# Patient Record
Sex: Male | Born: 1970 | Race: White | Hispanic: No | Marital: Married | Smoking: Former smoker
Health system: Southern US, Community
[De-identification: ages and names within clinical notes are randomized; demographics above are authoritative.]

## PROBLEM LIST (undated history)

## (undated) DIAGNOSIS — M179 Osteoarthritis of knee, unspecified: Secondary | ICD-10-CM

## (undated) DIAGNOSIS — E559 Vitamin D deficiency, unspecified: Secondary | ICD-10-CM

## (undated) DIAGNOSIS — Z8249 Family history of ischemic heart disease and other diseases of the circulatory system: Secondary | ICD-10-CM

## (undated) DIAGNOSIS — E781 Pure hyperglyceridemia: Secondary | ICD-10-CM

## (undated) DIAGNOSIS — G4700 Insomnia, unspecified: Secondary | ICD-10-CM

## (undated) DIAGNOSIS — I10 Essential (primary) hypertension: Secondary | ICD-10-CM

## (undated) DIAGNOSIS — E079 Disorder of thyroid, unspecified: Secondary | ICD-10-CM

## (undated) DIAGNOSIS — F1021 Alcohol dependence, in remission: Secondary | ICD-10-CM

## (undated) HISTORY — DX: Disorder of thyroid, unspecified: E07.9

## (undated) HISTORY — DX: Insomnia, unspecified: G47.00

## (undated) HISTORY — DX: Family history of ischemic heart disease and other diseases of the circulatory system: Z82.49

## (undated) HISTORY — DX: Essential (primary) hypertension: I10

## (undated) HISTORY — DX: Vitamin D deficiency, unspecified: E55.9

## (undated) HISTORY — DX: Osteoarthritis of knee, unspecified: M17.9

## (undated) HISTORY — DX: Alcohol dependence, in remission: F10.21

## (undated) HISTORY — DX: Pure hyperglyceridemia: E78.1

## (undated) HISTORY — DX: Hereditary hemochromatosis: E83.110

---

## 2004-06-26 HISTORY — PX: VASECTOMY: SHX75

## 2006-06-26 HISTORY — PX: EYE SURGERY: SHX253

## 2012-12-06 ENCOUNTER — Other Ambulatory Visit: Payer: Self-pay | Admitting: Endocrinology

## 2012-12-06 DIAGNOSIS — E213 Hyperparathyroidism, unspecified: Secondary | ICD-10-CM

## 2012-12-17 ENCOUNTER — Encounter (HOSPITAL_COMMUNITY): Payer: Self-pay

## 2012-12-19 ENCOUNTER — Encounter (HOSPITAL_COMMUNITY)
Admission: RE | Admit: 2012-12-19 | Discharge: 2012-12-19 | Disposition: A | Discharge status: Home / Self Care | Payer: 59 | Source: Ambulatory Visit | Attending: Endocrinology | Admitting: Endocrinology

## 2012-12-19 DIAGNOSIS — E213 Hyperparathyroidism, unspecified: Secondary | ICD-10-CM | POA: Insufficient documentation

## 2012-12-19 MED ORDER — TECHNETIUM TC 99M SESTAMIBI GENERIC - CARDIOLITE
25.0000 | Freq: Once | INTRAVENOUS | Status: AC | PRN
  Administered 2012-12-19: 25 via INTRAVENOUS

## 2012-12-23 ENCOUNTER — Encounter (HOSPITAL_COMMUNITY): Payer: 59

## 2012-12-23 ENCOUNTER — Encounter (HOSPITAL_COMMUNITY): Payer: Self-pay

## 2012-12-30 ENCOUNTER — Telehealth: Payer: Self-pay | Admitting: *Deleted

## 2012-12-30 NOTE — Telephone Encounter (Signed)
Pt called requesting results from his nuclear test done last week. Please advise.

## 2012-12-31 ENCOUNTER — Telehealth: Payer: Self-pay | Admitting: *Deleted

## 2012-12-31 DIAGNOSIS — E213 Hyperparathyroidism, unspecified: Secondary | ICD-10-CM

## 2012-12-31 NOTE — Addendum Note (Signed)
Addended by: Reather Littler on: 12/31/2012 09:37 AM   Modules accepted: Orders

## 2012-12-31 NOTE — Telephone Encounter (Signed)
LVM for pt to return call

## 2012-12-31 NOTE — Telephone Encounter (Signed)
Pt returned call. Advised him that he does have localized parathyroid abnormality in the inferior pole area of the left lobe of thyroid gland. Dr Lucianne Muss stated that if he was agreeable we can schedule him with Dr. Gerrit Friends for surgical consult. Pt states that would be great. If the appt could be scheduled on a Monday that would be the best day for him, due to his schedule. Please let the [facility] know this.Marland Kitchen

## 2012-12-31 NOTE — Telephone Encounter (Signed)
He does have localized parathyroid abnormality in the inferior pole area of the left lobe of thyroid gland, if agreeable we can schedule him with Dr. Gerrit Friends for surgical consult

## 2013-01-15 ENCOUNTER — Encounter (INDEPENDENT_AMBULATORY_CARE_PROVIDER_SITE_OTHER): Payer: Self-pay | Admitting: Surgery

## 2013-01-17 ENCOUNTER — Telehealth: Payer: Self-pay | Admitting: *Deleted

## 2013-01-17 NOTE — Telephone Encounter (Signed)
Arline Asp from [facility] called about this patient, she said that they need his parathyroid and calcium labs and your last documented note faxed to them at, [phone], he has an upcoming appt.

## 2013-01-21 ENCOUNTER — Encounter (INDEPENDENT_AMBULATORY_CARE_PROVIDER_SITE_OTHER): Payer: Self-pay | Admitting: Surgery

## 2013-01-21 ENCOUNTER — Ambulatory Visit (INDEPENDENT_AMBULATORY_CARE_PROVIDER_SITE_OTHER): Payer: 59 | Admitting: Surgery

## 2013-01-21 VITALS — BP 144/98 | HR 87 | Temp 98.0°F | Ht 71.0 in | Wt 179.4 lb

## 2013-01-21 DIAGNOSIS — E210 Primary hyperparathyroidism: Secondary | ICD-10-CM

## 2013-01-21 NOTE — Patient Instructions (Signed)

## 2013-01-21 NOTE — Progress Notes (Signed)
General Surgery [facility]  Chief Complaint  Patient presents with  . New Evaluation    eval parathyroid adenoma - referral from Dr. Reather Littler    HISTORY: Patient is a 42 year old white male referred by his endocrinologist for consideration for minimally invasive parathyroidectomy for management of primary hyperparathyroidism. Patient is followed closely by his primary care physician for hypertension. He was noted on routine laboratory studies to have an elevated serum calcium level of 11.2. He was referred to endocrinology. Further workup included a nuclear medicine parathyroid scan. This localized a parathyroid adenoma to the left inferior position. Patient is now referred to surgery for consideration for resection.  Patient has had no signs or symptoms. He denies nephrolithiasis. He has not had significant fatigue. He does have hypertension. He does not have urinary frequency.  Patient has had no prior head or neck surgery. There is no family history of endocrine disease.  Past Medical History  Diagnosis Date  . Hypertension   . Thyroid disease     Current Outpatient Prescriptions  Medication Sig Dispense Refill  . aspirin 81 MG tablet Take 81 mg by mouth daily.      Marland Kitchen losartan (COZAAR) 50 MG tablet        No current facility-administered medications for this visit.    No Known Allergies  Family History  Problem Relation Age of Onset  . Heart disease Father     History   Social History  . Marital Status: Married    Spouse Name: N/A    Number of Children: N/A  . Years of Education: N/A   Social History Main Topics  . Smoking status: Former Smoker    Quit date: 01/22/2011  . Smokeless tobacco: None  . Alcohol Use: Yes  . Drug Use: No  . Sexually Active: None   Other Topics Concern  . None   Social History Narrative  . None    REVIEW OF SYSTEMS - PERTINENT POSITIVES ONLY: Denies fatigue. Denies nephrolithiasis. Denies  tremor.  EXAM: Filed Vitals:   01/21/13 1012  BP: 144/98  Pulse: 87  Temp: 98 F (36.7 C)    HEENT: normocephalic; pupils equal and reactive; sclerae clear; dentition good; mucous membranes moist NECK:  symmetric on extension; no palpable anterior or posterior cervical lymphadenopathy; no supraclavicular masses; no tenderness CHEST: clear to auscultation bilaterally without rales, rhonchi, or wheezes CARDIAC: regular rate and rhythm without significant murmur; peripheral pulses are full EXT:  non-tender without edema; no deformity NEURO: no gross focal deficits; no sign of tremor   LABORATORY RESULTS: See [facility] (CHL-Epic) for most recent results  RADIOLOGY RESULTS: See [facility] (CHL-Epic) for most recent results  IMPRESSION: Primary hyperparathyroidism, likely left inferior parathyroid adenoma  PLAN: The patient and I reviewed his nuclear medicine study and his laboratory work. I gave him written literature on parathyroid disease and surgery for parathyroid adenoma to review at home. We discussed the procedure of minimally invasive parathyroidectomy. We discussed alternative surgery including 4 gland exploration. Patient understands there is a possibility of second gland adenoma at some point in the future.  We will plan for left inferior minimally invasive parathyroidectomy as an outpatient surgical procedure in the near future at a time convenient for the patient.  The risks and benefits of the procedure have been discussed at length with the patient.  The patient understands the proposed procedure, potential alternative treatments, and the course of recovery to be expected.  All of the patient's  questions have been answered at this time.  The patient wishes to proceed with surgery.  Velora Heckler, MD, FACS General & Endocrine Surgery [facility]  Primary Care Physician: Miguel Aschoff, MD

## 2013-01-23 ENCOUNTER — Encounter (INDEPENDENT_AMBULATORY_CARE_PROVIDER_SITE_OTHER): Payer: Self-pay

## 2013-02-03 ENCOUNTER — Other Ambulatory Visit: Payer: Self-pay | Admitting: Endocrinology

## 2013-02-03 ENCOUNTER — Other Ambulatory Visit: Payer: 59

## 2013-02-03 DIAGNOSIS — I10 Essential (primary) hypertension: Secondary | ICD-10-CM | POA: Insufficient documentation

## 2013-02-11 ENCOUNTER — Encounter (HOSPITAL_COMMUNITY): Payer: Self-pay | Admitting: Pharmacy Technician

## 2013-02-12 ENCOUNTER — Other Ambulatory Visit (HOSPITAL_COMMUNITY): Payer: Self-pay | Admitting: Surgery

## 2013-02-12 NOTE — Progress Notes (Signed)
OV WITH ekg Dr Anne Fu 4/14, aortic duplex scan 5/14, stress test with EKG 12/11 ALL ON CHART

## 2013-02-12 NOTE — Patient Instructions (Addendum)
20 Jachin Coury  02/12/2013   Your procedure is scheduled on:02/20/13  THURSDAY    Report to [facility] at   0900    AM.  Call this number if you have problems the morning of surgery: [phone]       Remember:   Do not eat food  Or drink :After Midnight. Wednesday NIGHT   Take these medicines the morning of surgery with A SIP OF WATER: NONE   .  Contacts, dentures or partial plates can not be worn to surgery  Leave suitcase in the car. After surgery it may be brought to your room.  For patients admitted to the hospital, checkout time is 11:00 AM day of  discharge.             SPECIAL INSTRUCTIONS- SEE [place] PREPARING FOR SURGERY INSTRUCTION SHEET-     DO NOT WEAR JEWELRY, LOTIONS, POWDERS, OR PERFUMES.  WOMEN-- DO NOT SHAVE LEGS OR UNDERARMS FOR 12 HOURS BEFORE SHOWERS. MEN MAY SHAVE FACE.  Patients discharged the day of surgery will not be allowed to drive home. IF going home the day of surgery, you must have a driver and someone to stay with you for the first 24 hours  Name and phone number of your driver:     Beatrix Shipper, RN,BSN  PST [phone]  [phone]                 FAILURE TO FOLLOW THESE INSTRUCTIONS MAY RESULT IN  CANCELLATION   OF YOUR SURGERY                                                  Patient Signature _____________________________

## 2013-02-13 ENCOUNTER — Encounter (HOSPITAL_COMMUNITY): Payer: Self-pay

## 2013-02-13 ENCOUNTER — Ambulatory Visit (HOSPITAL_COMMUNITY)
Admission: RE | Admit: 2013-02-13 | Discharge: 2013-02-13 | Disposition: A | Discharge status: Home / Self Care | Payer: 59 | Source: Ambulatory Visit | Attending: Surgery | Admitting: Surgery

## 2013-02-13 ENCOUNTER — Encounter (HOSPITAL_COMMUNITY)
Admission: RE | Admit: 2013-02-13 | Discharge: 2013-02-13 | Disposition: A | Discharge status: Home / Self Care | Payer: 59 | Source: Ambulatory Visit | Attending: Surgery | Admitting: Surgery

## 2013-02-13 DIAGNOSIS — I10 Essential (primary) hypertension: Secondary | ICD-10-CM | POA: Insufficient documentation

## 2013-02-13 DIAGNOSIS — Z01818 Encounter for other preprocedural examination: Secondary | ICD-10-CM | POA: Insufficient documentation

## 2013-02-13 DIAGNOSIS — E210 Primary hyperparathyroidism: Secondary | ICD-10-CM | POA: Insufficient documentation

## 2013-02-13 DIAGNOSIS — Z01812 Encounter for preprocedural laboratory examination: Secondary | ICD-10-CM | POA: Insufficient documentation

## 2013-02-13 LAB — BASIC METABOLIC PANEL
BUN: 19 mg/dL (ref 6–23)
CO2: 26 mEq/L (ref 19–32)
Chloride: 102 mEq/L (ref 96–112)
Glucose, Bld: 100 mg/dL — ABNORMAL HIGH (ref 70–99)
Potassium: 5.2 mEq/L — ABNORMAL HIGH (ref 3.5–5.1)

## 2013-02-13 LAB — CBC
HCT: 44.7 % (ref 39.0–52.0)
Hemoglobin: 15 g/dL (ref 13.0–17.0)
MCH: 32.1 pg (ref 26.0–34.0)
MCHC: 33.6 g/dL (ref 30.0–36.0)

## 2013-02-13 NOTE — Progress Notes (Signed)
Quick Note:  These results are acceptable for scheduled surgery.  Shariya Gaster M. Cleofas Hudgins, MD, FACS [facility] Office: [phone]   ______ 

## 2013-02-14 ENCOUNTER — Telehealth (INDEPENDENT_AMBULATORY_CARE_PROVIDER_SITE_OTHER): Payer: Self-pay | Admitting: *Deleted

## 2013-02-14 NOTE — Telephone Encounter (Signed)
Patient called today due to he thought he remembered being told he would need an EKG prior to surgery however he went to his pre-op yesterday and no EKG was done.  I do see an order for the EKG per anesthesia.  Patient asking what needs to happen at this time.  Explained that a message will be sent to Dr. Gerrit Friends to ask and then we will let him know.  Patient states understanding and agreeable at this time.

## 2013-02-14 NOTE — Telephone Encounter (Signed)
Per Dr Gerrit Friends I advised pt that per anesthesia means EKG only if anesthesia determines test is needed. Pt advised no EKG will be done unless pre surg testing advised pt they will be doing one.

## 2013-02-17 ENCOUNTER — Telehealth: Payer: Self-pay | Admitting: Endocrinology

## 2013-02-17 NOTE — Telephone Encounter (Signed)
Please see below and advise.

## 2013-02-17 NOTE — Telephone Encounter (Signed)
24 hr URINE completed at Urine.Marland KitchenMarland KitchenMarland KitchenGiven jug at [facility] but returned specimen to our office. He has never gotten results. Please call patient / Russell Rhodes.

## 2013-02-18 NOTE — Telephone Encounter (Signed)
Pt is aware of results. 

## 2013-02-18 NOTE — Telephone Encounter (Signed)
Test Was normal

## 2013-02-19 NOTE — Progress Notes (Signed)
Notified patient of surgery change time- to arrive at short stay at 0530am, NPO after midnight.  Notified Administrator in short stay that patient is aware of time change

## 2013-02-20 ENCOUNTER — Encounter (HOSPITAL_COMMUNITY): Payer: Self-pay | Admitting: *Deleted

## 2013-02-20 ENCOUNTER — Encounter (HOSPITAL_COMMUNITY): Payer: Self-pay | Admitting: Anesthesiology

## 2013-02-20 ENCOUNTER — Ambulatory Visit (HOSPITAL_COMMUNITY): Payer: 59 | Admitting: Anesthesiology

## 2013-02-20 ENCOUNTER — Ambulatory Visit (HOSPITAL_COMMUNITY)
Admission: RE | Admit: 2013-02-20 | Discharge: 2013-02-20 | Disposition: A | Discharge status: Home / Self Care | Payer: 59 | Source: Ambulatory Visit | Attending: Surgery | Admitting: Surgery

## 2013-02-20 ENCOUNTER — Encounter (HOSPITAL_COMMUNITY)
Admission: RE | Disposition: A | Discharge status: Home / Self Care | Payer: Self-pay | Source: Ambulatory Visit | Attending: Surgery

## 2013-02-20 DIAGNOSIS — E210 Primary hyperparathyroidism: Secondary | ICD-10-CM

## 2013-02-20 DIAGNOSIS — D351 Benign neoplasm of parathyroid gland: Secondary | ICD-10-CM | POA: Insufficient documentation

## 2013-02-20 DIAGNOSIS — Z7982 Long term (current) use of aspirin: Secondary | ICD-10-CM | POA: Insufficient documentation

## 2013-02-20 DIAGNOSIS — Z79899 Other long term (current) drug therapy: Secondary | ICD-10-CM | POA: Insufficient documentation

## 2013-02-20 DIAGNOSIS — I10 Essential (primary) hypertension: Secondary | ICD-10-CM | POA: Insufficient documentation

## 2013-02-20 HISTORY — PX: PARATHYROIDECTOMY: SHX19

## 2013-02-20 SURGERY — PARATHYROIDECTOMY
Anesthesia: General | Site: Neck | Laterality: Left | Wound class: Clean

## 2013-02-20 MED ORDER — SUCCINYLCHOLINE CHLORIDE 20 MG/ML IJ SOLN
INTRAMUSCULAR | Status: DC | PRN
  Administered 2013-02-20: 100 mg via INTRAVENOUS

## 2013-02-20 MED ORDER — GLYCOPYRROLATE 0.2 MG/ML IJ SOLN
INTRAMUSCULAR | Status: DC | PRN
  Administered 2013-02-20: 0.6 mg via INTRAVENOUS

## 2013-02-20 MED ORDER — MIDAZOLAM HCL 5 MG/5ML IJ SOLN
INTRAMUSCULAR | Status: DC | PRN
  Administered 2013-02-20: 1 mg via INTRAVENOUS
  Administered 2013-02-20: 2 mg via INTRAVENOUS
  Administered 2013-02-20: 1 mg via INTRAVENOUS

## 2013-02-20 MED ORDER — METOCLOPRAMIDE HCL 5 MG/ML IJ SOLN
INTRAMUSCULAR | Status: DC | PRN
  Administered 2013-02-20: 10 mg via INTRAVENOUS

## 2013-02-20 MED ORDER — OXYCODONE HCL 5 MG PO TABS
5.0000 mg | ORAL_TABLET | Freq: Once | ORAL | Status: AC | PRN
  Administered 2013-02-20: 5 mg via ORAL
  Filled 2013-02-20: qty 1

## 2013-02-20 MED ORDER — NEOSTIGMINE METHYLSULFATE 1 MG/ML IJ SOLN
INTRAMUSCULAR | Status: DC | PRN
  Administered 2013-02-20: 4 mg via INTRAVENOUS

## 2013-02-20 MED ORDER — OXYCODONE HCL 5 MG/5ML PO SOLN
5.0000 mg | Freq: Once | ORAL | Status: AC | PRN
  Filled 2013-02-20: qty 5

## 2013-02-20 MED ORDER — BUPIVACAINE HCL 0.25 % IJ SOLN
INTRAMUSCULAR | Status: DC | PRN
  Administered 2013-02-20: 10 mL

## 2013-02-20 MED ORDER — HYDROMORPHONE HCL PF 1 MG/ML IJ SOLN
INTRAMUSCULAR | Status: AC
  Filled 2013-02-20: qty 1

## 2013-02-20 MED ORDER — PROPOFOL 10 MG/ML IV BOLUS
INTRAVENOUS | Status: DC | PRN
  Administered 2013-02-20: 200 mg via INTRAVENOUS

## 2013-02-20 MED ORDER — DEXAMETHASONE SODIUM PHOSPHATE 10 MG/ML IJ SOLN
INTRAMUSCULAR | Status: DC | PRN
  Administered 2013-02-20: 10 mg via INTRAVENOUS

## 2013-02-20 MED ORDER — ROCURONIUM BROMIDE 100 MG/10ML IV SOLN
INTRAVENOUS | Status: DC | PRN
  Administered 2013-02-20: 10 mg via INTRAVENOUS

## 2013-02-20 MED ORDER — CEFAZOLIN SODIUM-DEXTROSE 2-3 GM-% IV SOLR
2.0000 g | INTRAVENOUS | Status: AC
  Administered 2013-02-20: 2 g via INTRAVENOUS

## 2013-02-20 MED ORDER — HYDROMORPHONE HCL PF 1 MG/ML IJ SOLN
0.2500 mg | INTRAMUSCULAR | Status: DC | PRN
  Administered 2013-02-20 (×2): 0.5 mg via INTRAVENOUS

## 2013-02-20 MED ORDER — HYDROCODONE-ACETAMINOPHEN 5-325 MG PO TABS: 1.0000 | ORAL_TABLET | ORAL | Status: DC | PRN

## 2013-02-20 MED ORDER — BUPIVACAINE HCL (PF) 0.25 % IJ SOLN
INTRAMUSCULAR | Status: AC
  Filled 2013-02-20: qty 30

## 2013-02-20 MED ORDER — SCOPOLAMINE 1 MG/3DAYS TD PT72
MEDICATED_PATCH | TRANSDERMAL | Status: AC
  Filled 2013-02-20: qty 1

## 2013-02-20 MED ORDER — SCOPOLAMINE 1 MG/3DAYS TD PT72
MEDICATED_PATCH | TRANSDERMAL | Status: DC | PRN
  Administered 2013-02-20: 1 via TRANSDERMAL

## 2013-02-20 MED ORDER — SUFENTANIL CITRATE 50 MCG/ML IV SOLN
INTRAVENOUS | Status: DC | PRN
  Administered 2013-02-20: 10 ug via INTRAVENOUS
  Administered 2013-02-20: 15 ug via INTRAVENOUS
  Administered 2013-02-20: 5 ug via INTRAVENOUS
  Administered 2013-02-20: 10 ug via INTRAVENOUS

## 2013-02-20 MED ORDER — CEFAZOLIN SODIUM-DEXTROSE 2-3 GM-% IV SOLR
INTRAVENOUS | Status: AC
  Filled 2013-02-20: qty 50

## 2013-02-20 MED ORDER — MEPERIDINE HCL 50 MG/ML IJ SOLN: 6.2500 mg | INTRAMUSCULAR | Status: DC | PRN

## 2013-02-20 MED ORDER — LIDOCAINE HCL (CARDIAC) 20 MG/ML IV SOLN
INTRAVENOUS | Status: DC | PRN
  Administered 2013-02-20: 80 mg via INTRAVENOUS

## 2013-02-20 MED ORDER — ONDANSETRON HCL 4 MG/2ML IJ SOLN
INTRAMUSCULAR | Status: DC | PRN
  Administered 2013-02-20: 4 mg via INTRAVENOUS

## 2013-02-20 MED ORDER — PROMETHAZINE HCL 25 MG/ML IJ SOLN: 6.2500 mg | INTRAMUSCULAR | Status: DC | PRN

## 2013-02-20 MED ORDER — LACTATED RINGERS IV SOLN
INTRAVENOUS | Status: DC | PRN
  Administered 2013-02-20 (×2): via INTRAVENOUS

## 2013-02-20 SURGICAL SUPPLY — 38 items
ATTRACTOMAT 16X20 MAGNETIC DRP (DRAPES) ×2 IMPLANT
BENZOIN TINCTURE PRP APPL 2/3 (GAUZE/BANDAGES/DRESSINGS) ×2 IMPLANT
BLADE HEX COATED 2.75 (ELECTRODE) ×2 IMPLANT
BLADE SURG 15 STRL LF DISP TIS (BLADE) ×1 IMPLANT
BLADE SURG 15 STRL SS (BLADE) ×1
CANISTER SUCTION 2500CC (MISCELLANEOUS) ×2 IMPLANT
CHLORAPREP W/TINT 10.5 ML (MISCELLANEOUS) ×2 IMPLANT
CLIP TI MEDIUM 6 (CLIP) ×4 IMPLANT
CLIP TI WIDE RED SMALL 6 (CLIP) ×4 IMPLANT
CLOTH BEACON ORANGE TIMEOUT ST (SAFETY) ×2 IMPLANT
DRAPE PED LAPAROTOMY (DRAPES) ×2 IMPLANT
DRESSING SURGICEL FIBRLLR 1X2 (HEMOSTASIS) ×1 IMPLANT
DRSG SURGICEL FIBRILLAR 1X2 (HEMOSTASIS) ×2
ELECT REM PT RETURN 9FT ADLT (ELECTROSURGICAL) ×2
ELECTRODE REM PT RTRN 9FT ADLT (ELECTROSURGICAL) ×1 IMPLANT
GAUZE SPONGE 4X4 16PLY XRAY LF (GAUZE/BANDAGES/DRESSINGS) ×2 IMPLANT
GLOVE SURG ORTHO 8.0 STRL STRW (GLOVE) ×2 IMPLANT
GOWN STRL REIN XL XLG (GOWN DISPOSABLE) ×6 IMPLANT
KIT BASIN OR (CUSTOM PROCEDURE TRAY) ×2 IMPLANT
NEEDLE HYPO 25X1 1.5 SAFETY (NEEDLE) ×2 IMPLANT
NS IRRIG 1000ML POUR BTL (IV SOLUTION) ×2 IMPLANT
PACK BASIC VI WITH GOWN DISP (CUSTOM PROCEDURE TRAY) ×2 IMPLANT
PENCIL BUTTON HOLSTER BLD 10FT (ELECTRODE) ×2 IMPLANT
SPONGE GAUZE 4X4 12PLY (GAUZE/BANDAGES/DRESSINGS) ×2 IMPLANT
STAPLER VISISTAT 35W (STAPLE) ×2 IMPLANT
STRIP CLOSURE SKIN 1/2X4 (GAUZE/BANDAGES/DRESSINGS) ×2 IMPLANT
SUT MNCRL AB 4-0 PS2 18 (SUTURE) ×2 IMPLANT
SUT SILK 2 0 (SUTURE)
SUT SILK 2-0 18XBRD TIE 12 (SUTURE) IMPLANT
SUT SILK 3 0 (SUTURE)
SUT SILK 3-0 18XBRD TIE 12 (SUTURE) IMPLANT
SUT VIC AB 3-0 SH 18 (SUTURE) ×2 IMPLANT
SYR BULB IRRIGATION 50ML (SYRINGE) ×2 IMPLANT
SYR CONTROL 10ML LL (SYRINGE) ×2 IMPLANT
TAPE CLOTH SURG 4X10 WHT LF (GAUZE/BANDAGES/DRESSINGS) ×2 IMPLANT
TOWEL OR 17X26 10 PK STRL BLUE (TOWEL DISPOSABLE) ×2 IMPLANT
TOWEL OR NON WOVEN STRL DISP B (DISPOSABLE) ×2 IMPLANT
YANKAUER SUCT BULB TIP 10FT TU (MISCELLANEOUS) ×2 IMPLANT

## 2013-02-20 NOTE — Interval H&P Note (Signed)
History and Physical Interval Note:  02/20/2013 7:18 AM  Russell Rhodes  has presented today for surgery, with the diagnosis of primary hyperparathyroidism.   The various methods of treatment have been discussed with the patient and family. After consideration of risks, benefits and other options for treatment, the patient has consented to    Procedure(s): left inferior PARATHYROIDECTOMY frozen section (Left) as a surgical intervention .    The patient's history has been reviewed, patient examined, no change in status, stable for surgery.  I have reviewed the patient's chart and labs.  Questions were answered to the patient's satisfaction.    Velora Heckler, MD, FACS General & Endocrine Surgery [facility] Office: [phone]  Russell Rhodes Judie Petit

## 2013-02-20 NOTE — Op Note (Signed)
OPERATIVE REPORT - PARATHYROIDECTOMY  Preoperative diagnosis: Primary hyperparathyroidism  Postop diagnosis: Same  Procedure: Left superior minimally invasive parathyroidectomy  Surgeon:  Velora Heckler, MD, FACS  Anesthesia: Gen. endotracheal  Estimated blood loss: Minimal  Preparation: ChloraPrep  Indications: Patient is a 42 year old white male referred by his endocrinologist for consideration for minimally invasive parathyroidectomy for management of primary hyperparathyroidism. Patient is followed closely by his primary care physician for hypertension. He was noted on routine laboratory studies to have an elevated serum calcium level of 11.2. He was referred to endocrinology. Further workup included a nuclear medicine parathyroid scan. This localized a parathyroid adenoma to the left inferior position.  Patient now comes to surgery for minimally invasive parathyroidectomy.  Procedure: Patient was prepared in the holding area. He was brought to operating room and placed in a supine position on the operating room table. Following administration of general anesthesia, the patient was positioned and then prepped and draped in the usual strict aseptic fashion. After ascertaining that an adequate level of anesthesia been achieved, a neck incision was made with a #15 blade. Dissection was carried through subcutaneous tissues and platysma. Hemostasis was obtained with the electrocautery. Skin flaps were developed circumferentially and a Weitlander retractor was placed for exposure.  Strap muscles were incised in the midline. Strap muscles were reflected exposing the thyroid lobe. With gentle blunt dissection the thyroid lobe was mobilized.  Dissection was carried through adipose tissue and an enlarged parathyroid gland was identified. It was gently mobilized. Vascular structures were divided between small and medium ligaclips. Care was taken to avoid the recurrent laryngeal nerve and the esophagus.  The parathyroid gland was completely excised. It was submitted to pathology where frozen section confirmed parathyroid tissue consistent with adenoma.  Neck was irrigated with warm saline and good hemostasis was noted. Fibrillar was placed in the operative field. Strap muscles were reapproximated in the midline with interrupted 3-0 Vicryl sutures. Platysma was closed with interrupted 3-0 Vicryl sutures. Skin was closed with a running 4-0 Monocryl subcuticular suture. Marcaine was infiltrated circumferentially. Wound was washed and dried and benzoin and Steri-Strips were applied. Sterile gauze dressings were applied. Patient was awakened from anesthesia and brought to the recovery room. The patient tolerated the procedure well.   Velora Heckler, MD, FACS General & Endocrine Surgery [facility]

## 2013-02-20 NOTE — H&P (View-Only) (Signed)
General Surgery - [facility]  Chief Complaint  Patient presents with  . New Evaluation    eval parathyroid adenoma - referral from Dr. Ajay Kumar    HISTORY: Patient is a 42-year-old white male referred by his endocrinologist for consideration for minimally invasive parathyroidectomy for management of primary hyperparathyroidism. Patient is followed closely by his primary care physician for hypertension. He was noted on routine laboratory studies to have an elevated serum calcium level of 11.2. He was referred to endocrinology. Further workup included a nuclear medicine parathyroid scan. This localized a parathyroid adenoma to the left inferior position. Patient is now referred to surgery for consideration for resection.  Patient has had no signs or symptoms. He denies nephrolithiasis. He has not had significant fatigue. He does have hypertension. He does not have urinary frequency.  Patient has had no prior head or neck surgery. There is no family history of endocrine disease.  Past Medical History  Diagnosis Date  . Hypertension   . Thyroid disease     Current Outpatient Prescriptions  Medication Sig Dispense Refill  . aspirin 81 MG tablet Take 81 mg by mouth daily.      . losartan (COZAAR) 50 MG tablet        No current facility-administered medications for this visit.    No Known Allergies  Family History  Problem Relation Age of Onset  . Heart disease Father     History   Social History  . Marital Status: Married    Spouse Name: N/A    Number of Children: N/A  . Years of Education: N/A   Social History Main Topics  . Smoking status: Former Smoker    Quit date: 01/22/2011  . Smokeless tobacco: None  . Alcohol Use: Yes  . Drug Use: No  . Sexually Active: None   Other Topics Concern  . None   Social History Narrative  . None    REVIEW OF SYSTEMS - PERTINENT POSITIVES ONLY: Denies fatigue. Denies nephrolithiasis. Denies  tremor.  EXAM: Filed Vitals:   01/21/13 1012  BP: 144/98  Pulse: 87  Temp: 98 F (36.7 C)    HEENT: normocephalic; pupils equal and reactive; sclerae clear; dentition good; mucous membranes moist NECK:  symmetric on extension; no palpable anterior or posterior cervical lymphadenopathy; no supraclavicular masses; no tenderness CHEST: clear to auscultation bilaterally without rales, rhonchi, or wheezes CARDIAC: regular rate and rhythm without significant murmur; peripheral pulses are full EXT:  non-tender without edema; no deformity NEURO: no gross focal deficits; no sign of tremor   LABORATORY RESULTS: See [facility] (CHL-Epic) for most recent results  RADIOLOGY RESULTS: See [facility] (CHL-Epic) for most recent results  IMPRESSION: Primary hyperparathyroidism, likely left inferior parathyroid adenoma  PLAN: The patient and I reviewed his nuclear medicine study and his laboratory work. I gave him written literature on parathyroid disease and surgery for parathyroid adenoma to review at home. We discussed the procedure of minimally invasive parathyroidectomy. We discussed alternative surgery including 4 gland exploration. Patient understands there is a possibility of second gland adenoma at some point in the future.  We will plan for left inferior minimally invasive parathyroidectomy as an outpatient surgical procedure in the near future at a time convenient for the patient.  The risks and benefits of the procedure have been discussed at length with the patient.  The patient understands the proposed procedure, potential alternative treatments, and the course of recovery to be expected.  All of the patient's   questions have been answered at this time.  The patient wishes to proceed with surgery.  Dina Warbington M. Anara Cowman, MD, FACS General & Endocrine Surgery [facility]  Primary Care Physician: ROSS,ALLAN, MD   

## 2013-02-20 NOTE — Anesthesia Postprocedure Evaluation (Signed)
Anesthesia Post Note  Patient: Russell Rhodes  Procedure(s) Performed: Procedure(s) (LRB): left superior minimally invasive PARATHYROIDECTOMY  (Left)  Anesthesia type: General  Patient location: PACU  Post pain: Pain level controlled  Post assessment: Post-op Vital signs reviewed  Last Vitals: BP 140/84  Pulse 76  Temp(Src) 36.7 C (Oral)  Resp 18  SpO2 100%  Post vital signs: Reviewed  Level of consciousness: sedated  Complications: No apparent anesthesia complications

## 2013-02-20 NOTE — Addendum Note (Signed)
Addendum created 02/20/13 1005 by Illene Silver, CRNA   Modules edited: Anesthesia Medication Administration

## 2013-02-20 NOTE — Transfer of Care (Signed)
Immediate Anesthesia Transfer of Care Note  Patient: Russell Rhodes  Procedure(s) Performed: Procedure(s): left superior minimally invasive PARATHYROIDECTOMY  (Left)  Patient Location: PACU  Anesthesia Type:General  Level of Consciousness: awake, alert , oriented and patient cooperative  Airway & Oxygen Therapy: Patient Spontanous Breathing and Patient connected to face mask oxygen  Post-op Assessment: Report given to PACU RN, Post -op Vital signs reviewed and stable and Patient moving all extremities X 4  Post vital signs: stable  Complications: No apparent anesthesia complications

## 2013-02-20 NOTE — Anesthesia Preprocedure Evaluation (Signed)
Anesthesia Evaluation  Patient identified by MRN, date of birth, ID band Patient awake    Reviewed: Allergy & Precautions, H&P , NPO status , Patient's Chart, lab work & pertinent test results  Airway Mallampati: II TM Distance: >3 FB Neck ROM: Full    Dental  (+) Dental Advisory Given and Teeth Intact   Pulmonary neg pulmonary ROS,  breath sounds clear to auscultation        Cardiovascular hypertension, Pt. on medications Rhythm:Regular Rate:Normal     Neuro/Psych negative neurological ROS  negative psych ROS   GI/Hepatic negative GI ROS, Neg liver ROS,   Endo/Other  negative endocrine ROS  Renal/GU negative Renal ROS     Musculoskeletal negative musculoskeletal ROS (+)   Abdominal   Peds  Hematology negative hematology ROS (+)   Anesthesia Other Findings   Reproductive/Obstetrics                           Anesthesia Physical Anesthesia Plan  ASA: II  Anesthesia Plan: General   Post-op Pain Management:    Induction: Intravenous  Airway Management Planned: Oral ETT  Additional Equipment:   Intra-op Plan:   Post-operative Plan: Extubation in OR  Informed Consent: I have reviewed the patients History and Physical, chart, labs and discussed the procedure including the risks, benefits and alternatives for the proposed anesthesia with the patient or authorized representative who has indicated his/her understanding and acceptance.   Dental advisory given  Plan Discussed with: CRNA  Anesthesia Plan Comments:         Anesthesia Quick Evaluation

## 2013-02-21 ENCOUNTER — Encounter (HOSPITAL_COMMUNITY): Payer: Self-pay | Admitting: Surgery

## 2013-02-25 ENCOUNTER — Other Ambulatory Visit (INDEPENDENT_AMBULATORY_CARE_PROVIDER_SITE_OTHER): Payer: Self-pay

## 2013-02-25 ENCOUNTER — Telehealth (INDEPENDENT_AMBULATORY_CARE_PROVIDER_SITE_OTHER): Payer: Self-pay

## 2013-02-25 NOTE — Telephone Encounter (Signed)
LMOM. PO appt 03-18-13 and to have labs drawn 03-14-13.

## 2013-03-18 ENCOUNTER — Ambulatory Visit (INDEPENDENT_AMBULATORY_CARE_PROVIDER_SITE_OTHER): Payer: 59 | Admitting: Surgery

## 2013-03-18 ENCOUNTER — Encounter (INDEPENDENT_AMBULATORY_CARE_PROVIDER_SITE_OTHER): Payer: Self-pay | Admitting: Surgery

## 2013-03-18 VITALS — BP 142/98 | HR 80 | Temp 97.2°F | Resp 16 | Ht 71.0 in | Wt 180.2 lb

## 2013-03-18 DIAGNOSIS — E210 Primary hyperparathyroidism: Secondary | ICD-10-CM

## 2013-03-18 NOTE — Patient Instructions (Signed)
  COCOA BUTTER & VITAMIN E CREAM  ([employer] or other brand)  Apply cocoa butter/vitamin E cream to your incision 2 - 3 times daily.  Massage cream into incision for one minute with each application.  Use sunscreen (50 SPF or higher) for first 6 months after surgery if area is exposed to sun.  You may substitute Mederma or other scar reducing creams as desired.   

## 2013-03-18 NOTE — Progress Notes (Signed)
General Surgery [facility]  Chief Complaint  Patient presents with  . Routine Post Op    parathyroidectomy 02/20/2013    HISTORY: Patient is a 42 year old male who underwent minimally invasive parathyroidectomy on 02/20/2013. Final pathology shows hypercellular parathyroid tissue consistent with parathyroid adenoma weighing 1.0 g and measuring 1.7 cm in maximum dimension. Postoperative course has been uneventful.  EXAM: Surgical incision is healing nicely. Minimal soft tissue swelling. No sign of seroma. No sign of infection. Voice quality is normal.  IMPRESSION: Status post minimally invasive parathyroidectomy  PLAN: Patient will begin applying topical creams to his incisions. He will return in 6 weeks for a wound check. We will check a serum calcium level and intact PTH level prior to that office visit.  Velora Heckler, MD, FACS General & Endocrine Surgery [facility]   Visit Diagnoses: 1. Hyperparathyroidism, primary

## 2013-05-01 ENCOUNTER — Other Ambulatory Visit: Payer: Self-pay

## 2013-05-08 LAB — PTH, INTACT AND CALCIUM: PTH: 54.7 pg/mL (ref 14.0–72.0)

## 2013-05-09 ENCOUNTER — Encounter (INDEPENDENT_AMBULATORY_CARE_PROVIDER_SITE_OTHER): Payer: 59 | Admitting: Surgery

## 2013-05-12 ENCOUNTER — Ambulatory Visit (INDEPENDENT_AMBULATORY_CARE_PROVIDER_SITE_OTHER): Payer: 59 | Admitting: Surgery

## 2013-05-12 ENCOUNTER — Encounter (INDEPENDENT_AMBULATORY_CARE_PROVIDER_SITE_OTHER): Payer: Self-pay | Admitting: Surgery

## 2013-05-12 VITALS — BP 124/78 | HR 68 | Temp 97.6°F | Resp 14 | Ht 71.0 in | Wt 181.6 lb

## 2013-05-12 DIAGNOSIS — E210 Primary hyperparathyroidism: Secondary | ICD-10-CM

## 2013-05-12 NOTE — Progress Notes (Signed)
General Surgery [facility]  Chief Complaint  Patient presents with  . Routine Post Op    parathyroidectomy 02/20/2013    HISTORY: Patient is a 42 year old male who underwent minimally invasive parathyroidectomy on 02/20/2013. He returns today for final wound check. Laboratory studies show a normal calcium level is 9.8 and a normal intact PTH level of 54.7.  EXAM: Surgical incision is nicely healed with a good cosmetic result. No sign of infection. Voice quality is normal.  IMPRESSION: Status post minimally invasive parathyroidectomy  PLAN: Patient will continue to apply topical creams to his incision. He should have his calcium level checked yearly.  Patient will return for surgical care as needed.  Velora Heckler, MD, FACS General & Endocrine Surgery [facility]   Visit Diagnoses: 1. Hyperparathyroidism, primary

## 2013-05-12 NOTE — Patient Instructions (Signed)
  COCOA BUTTER & VITAMIN E CREAM  ([employer] or other brand)  Apply cocoa butter/vitamin E cream to your incision 2 - 3 times daily.  Massage cream into incision for one minute with each application.  Use sunscreen (50 SPF or higher) for first 6 months after surgery if area is exposed to sun.  You may substitute Mederma or other scar reducing creams as desired.   

## 2017-04-22 ENCOUNTER — Emergency Department (HOSPITAL_BASED_OUTPATIENT_CLINIC_OR_DEPARTMENT_OTHER): Payer: 59

## 2017-04-22 ENCOUNTER — Encounter (HOSPITAL_BASED_OUTPATIENT_CLINIC_OR_DEPARTMENT_OTHER): Payer: Self-pay | Admitting: Respiratory Therapy

## 2017-04-22 ENCOUNTER — Emergency Department (HOSPITAL_BASED_OUTPATIENT_CLINIC_OR_DEPARTMENT_OTHER)
Admission: EM | Admit: 2017-04-22 | Discharge: 2017-04-22 | Disposition: A | Discharge status: Home / Self Care | Payer: 59 | Attending: Emergency Medicine | Admitting: Emergency Medicine

## 2017-04-22 DIAGNOSIS — Z87891 Personal history of nicotine dependence: Secondary | ICD-10-CM | POA: Insufficient documentation

## 2017-04-22 DIAGNOSIS — I10 Essential (primary) hypertension: Secondary | ICD-10-CM | POA: Diagnosis not present

## 2017-04-22 DIAGNOSIS — R0602 Shortness of breath: Secondary | ICD-10-CM | POA: Insufficient documentation

## 2017-04-22 DIAGNOSIS — Z79899 Other long term (current) drug therapy: Secondary | ICD-10-CM | POA: Insufficient documentation

## 2017-04-22 LAB — COMPREHENSIVE METABOLIC PANEL
ALK PHOS: 57 U/L (ref 38–126)
ALT: 36 U/L (ref 17–63)
AST: 26 U/L (ref 15–41)
Albumin: 5 g/dL (ref 3.5–5.0)
Anion gap: 10 (ref 5–15)
BILIRUBIN TOTAL: 0.8 mg/dL (ref 0.3–1.2)
BUN: 20 mg/dL (ref 6–20)
CALCIUM: 9.4 mg/dL (ref 8.9–10.3)
CO2: 26 mmol/L (ref 22–32)
CREATININE: 1.25 mg/dL — AB (ref 0.61–1.24)
Chloride: 101 mmol/L (ref 101–111)
Glucose, Bld: 133 mg/dL — ABNORMAL HIGH (ref 65–99)
Potassium: 4.1 mmol/L (ref 3.5–5.1)
Sodium: 137 mmol/L (ref 135–145)
TOTAL PROTEIN: 8 g/dL (ref 6.5–8.1)

## 2017-04-22 LAB — CBC WITH DIFFERENTIAL/PLATELET
Basophils Absolute: 0 10*3/uL (ref 0.0–0.1)
Basophils Relative: 1 %
Eosinophils Absolute: 0.1 10*3/uL (ref 0.0–0.7)
Eosinophils Relative: 2 %
HCT: 43.9 % (ref 39.0–52.0)
Hemoglobin: 14.8 g/dL (ref 13.0–17.0)
Lymphocytes Relative: 33 %
Lymphs Abs: 2.5 10*3/uL (ref 0.7–4.0)
MCH: 32.6 pg (ref 26.0–34.0)
MCHC: 33.7 g/dL (ref 30.0–36.0)
MCV: 96.7 fL (ref 78.0–100.0)
Monocytes Absolute: 0.9 10*3/uL (ref 0.1–1.0)
Monocytes Relative: 11 %
Neutro Abs: 4.1 10*3/uL (ref 1.7–7.7)
Neutrophils Relative %: 53 %
Platelets: 208 10*3/uL (ref 150–400)
RBC: 4.54 MIL/uL (ref 4.22–5.81)
RDW: 12 % (ref 11.5–15.5)
WBC: 7.6 10*3/uL (ref 4.0–10.5)

## 2017-04-22 LAB — D-DIMER, QUANTITATIVE: D-Dimer, Quant: 0.29 ug/mL-FEU (ref 0.00–0.50)

## 2017-04-22 NOTE — ED Provider Notes (Signed)
[facility] EMERGENCY DEPARTMENT Provider Note   CSN: [id number] Arrival date & time: 04/22/17  1525     History   Chief Complaint Chief Complaint  Patient presents with  . Shortness of Breath    HPI Russell Rhodes is a 46 y.o. male.  Patient is a 46 year old male with a history of hypertension and parathyroid disease presenting today with sudden onset of shortness of breath while he was sitting on the couch.  He denies any chest discomfort or pressure.  No pain between his shoulder blades or down his arm.  It was just the sensation of him having to take deeper breaths to feel fully oxygenated.  He denies any recent leg pain or swelling.  He has taken multiple plane flights for work in the last few weeks but they were no more than 2-3-hour flights.  He has a history of allergies but denies any recent congestion or cough.  He had not done anything exertional today prior to or during the symptoms.  He has stated since getting home from his trips he has had some slight abdominal discomfort and some loose stool but no frank abdominal pain, fever or diarrhea.  Patient quit smoking 6 years ago and is an occasional drinker.  He states that since being here his symptoms have now pretty much resolved.  Standing and walking around actually made him feel better while at home.   The history is provided by the patient.  Shortness of Breath  This is a new problem. The average episode lasts 45 minutes. The problem occurs continuously.The current episode started less than 1 hour ago. The problem has been resolved. Pertinent negatives include no fever, no headaches, no rhinorrhea, no sore throat, no neck pain, no cough, no sputum production, no wheezing, no chest pain, no syncope, no vomiting, no abdominal pain, no leg pain and no leg swelling.    Past Medical History:  Diagnosis Date  . Hypertension   . Thyroid disease     Patient Active Problem List   Diagnosis Date Noted  . Unspecified  essential hypertension 02/03/2013  . Hyperparathyroidism, primary ([place]) 01/21/2013    Past Surgical History:  Procedure Laterality Date  . EYE SURGERY  2008   lasik bilateral  . PARATHYROIDECTOMY Left 02/20/2013   Procedure: left superior minimally invasive PARATHYROIDECTOMY ;  Surgeon: Earnstine Regal, MD;  Location: [facility];  Service: General;  Laterality: Left;  Marland Kitchen VASECTOMY  2006       Home Medications    Prior to Admission medications   Medication Sig Start Date End Date Taking? Authorizing Provider  irbesartan (AVAPRO) 75 MG tablet Take 75 mg by mouth daily.   Yes [provider]  Javier Docker Oil 1000 MG CAPS Take by mouth.   Yes [provider]  aspirin 81 MG tablet Take 81 mg by mouth every morning.     [provider]  losartan (COZAAR) 50 MG tablet Take 50 mg by mouth every morning.  10/25/12   [provider]    Family History Family History  Problem Relation Age of Onset  . Heart disease Father     Social History Social History  Substance Use Topics  . Smoking status: Former Smoker    Quit date: 01/22/2011  . Smokeless tobacco: Never Used  . Alcohol use Yes     Comment: occassionally     Allergies   Patient has no known allergies.   Review of Systems Review of Systems  Constitutional: Negative for fever.  HENT: Negative for rhinorrhea and sore throat.   Respiratory: Positive for shortness of breath. Negative for cough, sputum production and wheezing.   Cardiovascular: Negative for chest pain, leg swelling and syncope.  Gastrointestinal: Negative for abdominal pain and vomiting.  Musculoskeletal: Negative for neck pain.  Neurological: Negative for headaches.  All other systems reviewed and are negative.    Physical Exam Updated Vital Signs BP (!) 169/97 (BP Location: Left Arm)   Pulse 81   Temp 97.8 F (36.6 C) (Oral)   Resp 18   Ht 5\' 11"  (1.803 m)   Wt 83.9 kg (185 lb)   SpO2 98%   BMI 25.80 kg/m   Physical  Exam  Constitutional: He is oriented to person, place, and time. He appears well-developed and well-nourished. No distress.  HENT:  Head: Normocephalic and atraumatic.  Mouth/Throat: Oropharynx is clear and moist.  Eyes: Pupils are equal, round, and reactive to light. Conjunctivae and EOM are normal.  Neck: Normal range of motion. Neck supple.  Cardiovascular: Normal rate, regular rhythm and intact distal pulses.   No murmur heard. Pulses are equal in bilateral upper and lower extremities  Pulmonary/Chest: Effort normal and breath sounds normal. No respiratory distress. He has no wheezes. He has no rales.  Abdominal: Soft. He exhibits no distension. There is no tenderness. There is no rebound and no guarding.  Musculoskeletal: Normal range of motion. He exhibits no edema or tenderness.  No Calf pain or swelling  Neurological: He is alert and oriented to person, place, and time.  Skin: Skin is warm and dry. No rash noted. No erythema.  Psychiatric: He has a normal mood and affect. His behavior is normal.  Nursing note and vitals reviewed.    ED Treatments / Results  Labs (all labs ordered are listed, but only abnormal results are displayed) Labs Reviewed  COMPREHENSIVE METABOLIC PANEL - Abnormal; Notable for the following:       Result Value   Glucose, Bld 133 (*)    Creatinine, Ser 1.25 (*)    All other components within normal limits  CBC WITH DIFFERENTIAL/PLATELET  D-DIMER, QUANTITATIVE (NOT AT [facility])    EKG  EKG Interpretation  Date/Time:  Sunday April 22 2017 15:41:54 EDT Ventricular Rate:  86 PR Interval:  128 QRS Duration: 84 QT Interval:  362 QTC Calculation: 433 R Axis:   59 Text Interpretation:  Normal sinus rhythm Normal ECG No previous tracing Confirmed by Blanchie Dessert [phone]) on 04/22/2017 4:24:36 PM       Radiology Dg Chest 2 View  Result Date: 04/22/2017 CLINICAL DATA:  Shortness of breath for 45 minutes EXAM: CHEST  2 VIEW COMPARISON:   02/13/2013 FINDINGS: Normal heart size and mediastinal contours. No acute infiltrate or edema. No effusion or pneumothorax. No acute osseous findings. IMPRESSION: Negative chest. Electronically Signed   By: Monte Fantasia M.D.   On: 04/22/2017 16:13    Procedures Procedures (including critical care time)  Medications Ordered in ED Medications - No data to display   Initial Impression / Assessment and Plan / ED Course  I have reviewed the triage vital signs and the nursing notes.  Pertinent labs & imaging results that were available during my care of the patient were reviewed by me and considered in my medical decision making (see chart for details).     Patient presenting with sudden onset of shortness of breath while sitting on the couch today.  He denied any other symptoms such  as cough, chest pain or discomfort of any kind.  He states nothing like this is ever happened before.  He did not feel anxious prior to symptoms occurring.  His only complaint is some mild abdominal discomfort and loose stools for the last few days since returning home from a trip.  He denies any international travel.  Patient's EKG was normal, chest x-ray without any evidence of infiltrate, pneumothorax or cardiomegaly.  Patient CBC shows a normal hemoglobin and CMP with mild elevation of creatinine but unclear if that is baseline.  Patient's d-dimer was also negative.  Low suspicion for PE, dissection, MI or pneumothorax at this time.  Patient got up and walked here with sats close to 100% without any return of symptoms.  Patient was hypertensive here but he does have a history of hypertension.  Recommended the patient follow-up with his PCP if symptoms recur for potential stress testing or pulmonary function test.  Cautioned that he return immediately if he develops any pain in his chest, back or abdomen  Final Clinical Impressions(s) / ED Diagnoses   Final diagnoses:  SOB (shortness of breath)    New  Prescriptions New Prescriptions   No medications on file     Blanchie Dessert, MD 04/22/17 1734

## 2017-04-22 NOTE — ED Triage Notes (Signed)
SOB x 45 minutes. Denies chest pain. Recent long airline travel.

## 2017-04-22 NOTE — ED Notes (Signed)
Ambulatory without distress.  O2 97-98%, HR 80-90.  No SOB.

## 2017-04-22 NOTE — ED Notes (Signed)
Pt discharged to home with family. NAD.  

## 2018-05-15 ENCOUNTER — Other Ambulatory Visit: Payer: Self-pay | Admitting: Gastroenterology

## 2018-05-15 ENCOUNTER — Other Ambulatory Visit (HOSPITAL_COMMUNITY): Payer: Self-pay | Admitting: Gastroenterology

## 2018-05-15 DIAGNOSIS — R112 Nausea with vomiting, unspecified: Secondary | ICD-10-CM

## 2018-05-17 ENCOUNTER — Ambulatory Visit
Admission: RE | Admit: 2018-05-17 | Discharge: 2018-05-17 | Disposition: A | Discharge status: Home / Self Care | Payer: 59 | Source: Ambulatory Visit | Attending: Gastroenterology | Admitting: Gastroenterology

## 2018-05-17 DIAGNOSIS — R112 Nausea with vomiting, unspecified: Secondary | ICD-10-CM

## 2018-05-29 ENCOUNTER — Encounter (HOSPITAL_COMMUNITY)
Admission: RE | Admit: 2018-05-29 | Discharge: 2018-05-29 | Disposition: A | Discharge status: Home / Self Care | Payer: 59 | Source: Ambulatory Visit | Attending: Gastroenterology | Admitting: Gastroenterology

## 2018-05-29 DIAGNOSIS — R112 Nausea with vomiting, unspecified: Secondary | ICD-10-CM | POA: Diagnosis not present

## 2018-05-29 MED ORDER — TECHNETIUM TC 99M SULFUR COLLOID
2.0000 | Freq: Once | INTRAVENOUS | Status: AC | PRN
  Administered 2018-05-29: 2 via ORAL

## 2018-07-09 ENCOUNTER — Other Ambulatory Visit: Payer: Self-pay | Admitting: Gastroenterology

## 2018-07-09 ENCOUNTER — Other Ambulatory Visit (HOSPITAL_COMMUNITY): Payer: Self-pay | Admitting: Gastroenterology

## 2018-07-18 ENCOUNTER — Emergency Department (HOSPITAL_BASED_OUTPATIENT_CLINIC_OR_DEPARTMENT_OTHER)
Admission: EM | Admit: 2018-07-18 | Discharge: 2018-07-18 | Disposition: A | Discharge status: Home / Self Care | Payer: 59 | Attending: Emergency Medicine | Admitting: Emergency Medicine

## 2018-07-18 ENCOUNTER — Other Ambulatory Visit: Payer: Self-pay

## 2018-07-18 ENCOUNTER — Encounter (HOSPITAL_BASED_OUTPATIENT_CLINIC_OR_DEPARTMENT_OTHER): Payer: Self-pay | Admitting: *Deleted

## 2018-07-18 DIAGNOSIS — Y929 Unspecified place or not applicable: Secondary | ICD-10-CM | POA: Diagnosis not present

## 2018-07-18 DIAGNOSIS — Y998 Other external cause status: Secondary | ICD-10-CM | POA: Diagnosis not present

## 2018-07-18 DIAGNOSIS — S0990XA Unspecified injury of head, initial encounter: Secondary | ICD-10-CM | POA: Diagnosis present

## 2018-07-18 DIAGNOSIS — Y9389 Activity, other specified: Secondary | ICD-10-CM | POA: Diagnosis not present

## 2018-07-18 DIAGNOSIS — W19XXXA Unspecified fall, initial encounter: Secondary | ICD-10-CM

## 2018-07-18 DIAGNOSIS — S0101XA Laceration without foreign body of scalp, initial encounter: Secondary | ICD-10-CM | POA: Diagnosis not present

## 2018-07-18 DIAGNOSIS — Z7982 Long term (current) use of aspirin: Secondary | ICD-10-CM | POA: Insufficient documentation

## 2018-07-18 DIAGNOSIS — Z79899 Other long term (current) drug therapy: Secondary | ICD-10-CM | POA: Diagnosis not present

## 2018-07-18 DIAGNOSIS — Z87891 Personal history of nicotine dependence: Secondary | ICD-10-CM | POA: Diagnosis not present

## 2018-07-18 DIAGNOSIS — I10 Essential (primary) hypertension: Secondary | ICD-10-CM | POA: Insufficient documentation

## 2018-07-18 DIAGNOSIS — W01198A Fall on same level from slipping, tripping and stumbling with subsequent striking against other object, initial encounter: Secondary | ICD-10-CM | POA: Insufficient documentation

## 2018-07-18 HISTORY — DX: Hemochromatosis, unspecified: E83.119

## 2018-07-18 LAB — CBG MONITORING, ED: GLUCOSE-CAPILLARY: 93 mg/dL (ref 70–99)

## 2018-07-18 MED ORDER — SODIUM CHLORIDE 0.9 % IV BOLUS: 1000.0000 mL | Freq: Once | INTRAVENOUS | Status: DC

## 2018-07-18 MED ORDER — LIDOCAINE-EPINEPHRINE (PF) 2 %-1:200000 IJ SOLN
5.0000 mL | Freq: Once | INTRAMUSCULAR | Status: AC
  Administered 2018-07-18: 5 mL
  Filled 2018-07-18 (×2): qty 10

## 2018-07-18 NOTE — ED Provider Notes (Signed)
[facility] EMERGENCY DEPARTMENT Provider Note   CSN: [id number] Arrival date & time: 07/18/18  2118     History   Chief Complaint Chief Complaint  Patient presents with  . Fall  . Laceration    HPI Russell Rhodes is a 48 y.o. male.  HPI Patient reports he was walking down the steps and misstepped falling forward and striking his forehead on the wall.  Denies he had any loss of consciousness.  He reports that he was not aware that he was bleeding until his daughter pointed it out to him.  Denies he has any generalized headache.  No visual changes.  Patient is here with his wife.  Patient does endorse alcohol use.  He does not quantify how much he has had to drink this evening.  Past Medical History:  Diagnosis Date  . Hemochromatosis   . Hypertension   . Thyroid disease     Patient Active Problem List   Diagnosis Date Noted  . Unspecified essential hypertension 02/03/2013  . Hyperparathyroidism, primary ([place]) 01/21/2013    Past Surgical History:  Procedure Laterality Date  . EYE SURGERY  2008   lasik bilateral  . PARATHYROIDECTOMY Left 02/20/2013   Procedure: left superior minimally invasive PARATHYROIDECTOMY ;  Surgeon: Earnstine Regal, MD;  Location: [facility];  Service: General;  Laterality: Left;  Marland Kitchen VASECTOMY  2006        Home Medications    Prior to Admission medications   Medication Sig Start Date End Date Taking? Authorizing Provider  aspirin 81 MG tablet Take 81 mg by mouth every morning.    Yes [provider]  irbesartan (AVAPRO) 75 MG tablet Take 75 mg by mouth daily.   Yes [provider]  Javier Docker Oil 1000 MG CAPS Take by mouth.   Yes [provider]  losartan (COZAAR) 50 MG tablet Take 50 mg by mouth every morning.  10/25/12  Yes [provider]    Family History Family History  Problem Relation Age of Onset  . Heart disease Father     Social History Social History   Tobacco Use  . Smoking status: Former  Smoker    Last attempt to quit: 01/22/2011    Years since quitting: 7.4  . Smokeless tobacco: Never Used  Substance Use Topics  . Alcohol use: Yes    Comment: occassionally  . Drug use: No     Allergies   Patient has no known allergies.   Review of Systems Review of Systems 10 Systems reviewed and are negative for acute change except as noted in the HPI.   Physical Exam Updated Vital Signs BP (!) 146/101   Pulse 93   Temp 98.1 F (36.7 C) (Oral)   Resp 19   Ht 6' (1.829 m)   Wt 81.2 kg   SpO2 91%   BMI 24.28 kg/m   Physical Exam Constitutional:      Appearance: He is well-developed.     Comments: Patient smells faintly of alcohol.  He is alert and nontoxic.  Interactive and no acute distress.  HENT:     Head:     Comments: 2 cm laceration in the scalp right at the midline just above the hairline.  Slight gaping.    Nose: Nose normal.     Mouth/Throat:     Mouth: Mucous membranes are moist.  Eyes:     Extraocular Movements: Extraocular movements intact.     Pupils: Pupils are equal, round, and  reactive to light.  Neck:     Musculoskeletal: Neck supple.  Cardiovascular:     Rate and Rhythm: Normal rate and regular rhythm.     Heart sounds: Normal heart sounds.  Pulmonary:     Effort: Pulmonary effort is normal.     Breath sounds: Normal breath sounds.  Abdominal:     General: Bowel sounds are normal. There is no distension.     Palpations: Abdomen is soft.     Tenderness: There is no abdominal tenderness.  Musculoskeletal: Normal range of motion.  Skin:    General: Skin is warm and dry.  Neurological:     Mental Status: He is alert and oriented to person, place, and time.     GCS: GCS eye subscore is 4. GCS verbal subscore is 5. GCS motor subscore is 6.     Coordination: Coordination normal.      ED Treatments / Results  Labs (all labs ordered are listed, but only abnormal results are displayed) Labs Reviewed  CBG MONITORING, ED     EKG None  Radiology No results found.  Procedures .Marland KitchenLaceration Repair Date/Time: 07/19/2018 12:33 AM Performed by: Charlesetta Shanks, MD Authorized by: Charlesetta Shanks, MD   Consent:    Consent obtained:  Verbal   Consent given by:  Patient   Risks discussed:  Infection, pain and poor cosmetic result   Alternatives discussed:  Delayed treatment and no treatment Anesthesia (see MAR for exact dosages):    Anesthesia method:  Local infiltration   Local anesthetic:  Lidocaine 2% WITH epi Laceration details:    Location:  Scalp   Scalp location:  Frontal   Length (cm):  2   Depth (mm):  4 Repair type:    Repair type:  Simple Pre-procedure details:    Preparation:  Patient was prepped and draped in usual sterile fashion Exploration:    Wound extent: areolar tissue violated     Contaminated: no   Treatment:    Area cleansed with:  Saline   Amount of cleaning:  Standard   Irrigation solution:  Sterile saline   Irrigation method:  Syringe Skin repair:    Repair method:  Sutures   Suture size:  4-0   Suture material:  Nylon   Suture technique:  Simple interrupted Approximation:    Approximation:  Close Post-procedure details:    Patient tolerance of procedure:  Tolerated well, no immediate complications   (including critical care time)  Medications Ordered in ED Medications  lidocaine-EPINEPHrine (XYLOCAINE W/EPI) 2 %-1:200000 (PF) injection 5 mL (has no administration in time range)     Initial Impression / Assessment and Plan / ED Course  I have reviewed the triage vital signs and the nursing notes.  Pertinent labs & imaging results that were available during my care of the patient were reviewed by me and considered in my medical decision making (see chart for details).    Patient misstepped on the stairs falling forward into the wall and striking his scalp.  Has a scalp laceration without other associated injury.  Laceration repaired.  Patient is alert and  interactive.  He does appear to have had some alcohol consumption this evening but mental status is clear.  No headache, no loss of consciousness do not suspect intracranial injury.  Final Clinical Impressions(s) / ED Diagnoses   Final diagnoses:  Fall, initial encounter  Scalp laceration, initial encounter    ED Discharge Orders    None       Leonard Feigel,  Jeannie Done, MD 07/19/18 [phone]

## 2018-07-18 NOTE — ED Notes (Signed)
ED Provider at bedside. 

## 2018-07-18 NOTE — Discharge Instructions (Addendum)
Put antibiotic ointment on your wound twice daily.  You may start washing your hair in 24 hours. See your doctor for removal of the stitches in 7 to 10 days.

## 2018-07-18 NOTE — ED Triage Notes (Signed)
He missed a step and fell down 3 steps after getting weak. 2" laceration to his forehead into his scalp. No LOC. States he does not feel good. Has not eaten all day.

## 2018-07-18 NOTE — ED Notes (Signed)
Pt's wife reports pt has a new dx of hemochromatosis. Pt has had decreased appetite recently. Reports he "felt weak" while walking down steps and fell. Pt has a liver biopsy scheduled for Monday. Denies etoh today

## 2018-07-18 NOTE — ED Notes (Signed)
Pt ambulated to DC window with steady gait. Family present to drive

## 2018-07-19 ENCOUNTER — Other Ambulatory Visit: Payer: Self-pay | Admitting: Radiology

## 2018-07-22 ENCOUNTER — Ambulatory Visit (HOSPITAL_COMMUNITY)
Admission: RE | Admit: 2018-07-22 | Discharge: 2018-07-22 | Disposition: A | Discharge status: Home / Self Care | Payer: 59 | Source: Ambulatory Visit | Attending: Gastroenterology | Admitting: Gastroenterology

## 2018-07-22 ENCOUNTER — Other Ambulatory Visit: Payer: Self-pay

## 2018-07-22 ENCOUNTER — Encounter (HOSPITAL_COMMUNITY): Payer: Self-pay

## 2018-07-22 DIAGNOSIS — E079 Disorder of thyroid, unspecified: Secondary | ICD-10-CM | POA: Diagnosis not present

## 2018-07-22 DIAGNOSIS — Z87891 Personal history of nicotine dependence: Secondary | ICD-10-CM | POA: Diagnosis not present

## 2018-07-22 DIAGNOSIS — Z8249 Family history of ischemic heart disease and other diseases of the circulatory system: Secondary | ICD-10-CM | POA: Insufficient documentation

## 2018-07-22 DIAGNOSIS — R531 Weakness: Secondary | ICD-10-CM | POA: Insufficient documentation

## 2018-07-22 DIAGNOSIS — R110 Nausea: Secondary | ICD-10-CM | POA: Insufficient documentation

## 2018-07-22 DIAGNOSIS — I10 Essential (primary) hypertension: Secondary | ICD-10-CM | POA: Diagnosis not present

## 2018-07-22 LAB — CBC
HCT: 40.2 % (ref 39.0–52.0)
Hemoglobin: 13.1 g/dL (ref 13.0–17.0)
MCH: 32.8 pg (ref 26.0–34.0)
MCHC: 32.6 g/dL (ref 30.0–36.0)
MCV: 100.8 fL — AB (ref 80.0–100.0)
PLATELETS: 126 10*3/uL — AB (ref 150–400)
RBC: 3.99 MIL/uL — ABNORMAL LOW (ref 4.22–5.81)
RDW: 13.2 % (ref 11.5–15.5)
WBC: 2.9 10*3/uL — ABNORMAL LOW (ref 4.0–10.5)
nRBC: 0 % (ref 0.0–0.2)

## 2018-07-22 LAB — PROTIME-INR
INR: 0.92
PROTHROMBIN TIME: 12.2 s (ref 11.4–15.2)

## 2018-07-22 MED ORDER — GELATIN ABSORBABLE 12-7 MM EX MISC
CUTANEOUS | Status: AC
  Filled 2018-07-22: qty 1

## 2018-07-22 MED ORDER — LIDOCAINE HCL (PF) 1 % IJ SOLN
INTRAMUSCULAR | Status: AC
  Filled 2018-07-22: qty 30

## 2018-07-22 MED ORDER — FENTANYL CITRATE (PF) 100 MCG/2ML IJ SOLN
INTRAMUSCULAR | Status: AC
  Filled 2018-07-22: qty 2

## 2018-07-22 MED ORDER — HYDROCODONE-ACETAMINOPHEN 5-325 MG PO TABS: 1.0000 | ORAL_TABLET | ORAL | Status: DC | PRN

## 2018-07-22 MED ORDER — MIDAZOLAM HCL 2 MG/2ML IJ SOLN
INTRAMUSCULAR | Status: AC
  Filled 2018-07-22: qty 2

## 2018-07-22 MED ORDER — MIDAZOLAM HCL 2 MG/2ML IJ SOLN
INTRAMUSCULAR | Status: AC | PRN
  Administered 2018-07-22: 0.5 mg via INTRAVENOUS
  Administered 2018-07-22: 1 mg via INTRAVENOUS

## 2018-07-22 MED ORDER — FENTANYL CITRATE (PF) 100 MCG/2ML IJ SOLN
INTRAMUSCULAR | Status: AC | PRN
  Administered 2018-07-22: 50 ug via INTRAVENOUS

## 2018-07-22 MED ORDER — SODIUM CHLORIDE 0.9 % IV SOLN: INTRAVENOUS | Status: DC

## 2018-07-22 NOTE — Procedures (Signed)
  Procedure: US core liver biopsy  18g x3 EBL:   minimal Complications:  none immediate  See full dictation in [facility] PACS.  D. Mckinna Demars MD Main # [phone] Pager  [phone]    

## 2018-07-22 NOTE — H&P (Addendum)
Chief Complaint: Patient was seen in consultation today for liver core biopsy at the request of Ronnette Juniper  Referring Physician(s): Ronnette Juniper  Supervising Physician: Arne Cleveland  Patient Status: [facility] - Out-pt  History of Present Illness: Russell Rhodes is a 48 y.o. male   Pt states he has had nausea and general weakness for several  weeks Blood work at MD office was abnormal and started liver evaluation  [place] 05/17/18: IMPRESSION: Heterogeneous coarse echotexture. No focal hepatic abnormality. No acute or focal abnormality identified.  Normal gastric emptying study 05/29/18.  Blood work at Dr Therisa Doyne office reveals Hemochromatosis  Now scheduled for liver core biopsy for evaluation  Past Medical History:  Diagnosis Date  . Hemochromatosis   . Hypertension   . Thyroid disease     Past Surgical History:  Procedure Laterality Date  . EYE SURGERY  2008   lasik bilateral  . PARATHYROIDECTOMY Left 02/20/2013   Procedure: left superior minimally invasive PARATHYROIDECTOMY ;  Surgeon: Earnstine Regal, MD;  Location: [facility];  Service: General;  Laterality: Left;  Marland Kitchen VASECTOMY  2006    Allergies: Patient has no known allergies.  Medications: Prior to Admission medications   Medication Sig Start Date End Date Taking? Authorizing Provider  irbesartan (AVAPRO) 300 MG tablet Take 300 mg by mouth daily.    Yes [provider]  Javier Docker Oil 1000 MG CAPS Take 1 capsule by mouth daily.    Yes [provider]     Family History  Problem Relation Age of Onset  . Heart disease Father     Social History   Socioeconomic History  . Marital status: Married    Spouse name: Not on file  . Number of children: Not on file  . Years of education: Not on file  . Highest education level: Not on file  Occupational History  . Not on file  Social Needs  . Financial resource strain: Not on file  . Food insecurity:    Worry: Not on file    Inability: Not on file  .  Transportation needs:    Medical: Not on file    Non-medical: Not on file  Tobacco Use  . Smoking status: Former Smoker    Last attempt to quit: 01/22/2011    Years since quitting: 7.5  . Smokeless tobacco: Never Used  Substance and Sexual Activity  . Alcohol use: Yes    Comment: occassionally  . Drug use: No  . Sexual activity: Not on file  Lifestyle  . Physical activity:    Days per week: Not on file    Minutes per session: Not on file  . Stress: Not on file  Relationships  . Social connections:    Talks on phone: Not on file    Gets together: Not on file    Attends religious service: Not on file    Active member of club or organization: Not on file    Attends meetings of clubs or organizations: Not on file    Relationship status: Not on file  Other Topics Concern  . Not on file  Social History Narrative  . Not on file    Review of Systems: A 12 point ROS discussed and pertinent positives are indicated in the HPI above.  All other systems are negative.  Review of Systems  Constitutional: Positive for appetite change. Negative for fatigue and fever.  Respiratory: Negative for cough and choking.   Cardiovascular: Negative for chest pain.  Gastrointestinal: Positive for  abdominal pain, nausea and vomiting.  Musculoskeletal: Negative for back pain.  Neurological: Positive for weakness.  Psychiatric/Behavioral: Negative for behavioral problems and confusion.    Vital Signs: BP (!) 141/101   Pulse 74   Temp 97.7 F (36.5 C) (Oral)   Ht 5\' 11"  (1.803 m)   Wt 173 lb (78.5 kg)   SpO2 96%   BMI 24.13 kg/m   Physical Exam Vitals signs reviewed.  Constitutional:      Appearance: Normal appearance.  Cardiovascular:     Rate and Rhythm: Normal rate and regular rhythm.     Heart sounds: Normal heart sounds.  Pulmonary:     Effort: Pulmonary effort is normal.     Breath sounds: Normal breath sounds.  Abdominal:     General: Bowel sounds are normal.     Palpations:  Abdomen is soft.     Tenderness: There is no abdominal tenderness.  Musculoskeletal: Normal range of motion.  Skin:    General: Skin is warm and dry.  Neurological:     General: No focal deficit present.     Mental Status: He is alert and oriented to person, place, and time.  Psychiatric:        Mood and Affect: Mood normal.        Behavior: Behavior normal.        Thought Content: Thought content normal.        Judgment: Judgment normal.     Imaging: No results found.  Labs:  CBC: No results for input(s): WBC, HGB, HCT, PLT in the last 8760 hours.  COAGS: No results for input(s): INR, APTT in the last 8760 hours.  BMP: No results for input(s): NA, K, CL, CO2, GLUCOSE, BUN, CALCIUM, CREATININE, GFRNONAA, GFRAA in the last 8760 hours.  Invalid input(s): CMP  LIVER FUNCTION TESTS: No results for input(s): BILITOT, AST, ALT, ALKPHOS, PROT, ALBUMIN in the last 8760 hours.  TUMOR MARKERS: No results for input(s): AFPTM, CEA, CA199, CHROMGRNA in the last 8760 hours.  Assessment and Plan:  N/V/weakness for months Blood work revealing hemochromatosis Scheduled now for liver core biopsy Risks and benefits discussed with the patient including, but not limited to bleeding, infection, damage to adjacent structures or low yield requiring additional tests.  All of the patient's questions were answered, patient is agreeable to proceed. Consent signed and in chart.   Thank you for this interesting consult.  I greatly enjoyed meeting Russell Rhodes and look forward to participating in their care.  A copy of this report was sent to the requesting provider on this date.  Electronically Signed: Lavonia Drafts, PA-C 07/22/2018, 12:15 PM   I spent a total of  30 Minutes   in face to face in clinical consultation, greater than 50% of which was counseling/coordinating care for liver core biopsy

## 2018-07-22 NOTE — Discharge Instructions (Signed)
Liver Biopsy, Care After °These instructions give you information on caring for yourself after your procedure. Your doctor may also give you more specific instructions. Call your doctor if you have any problems or questions after your procedure. °What can I expect after the procedure? °After the procedure, it is common to have: °· Pain and soreness where the biopsy was done. °· Bruising around the area where the biopsy was done. °· Sleepiness and be tired for a few days. °Follow these instructions at home: °Medicines °· Take over-the-counter and prescription medicines only as told by your doctor. °· If you were prescribed an antibiotic medicine, take it as told by your doctor. Do not stop taking the antibiotic even if you start to feel better. °· Do not take medicines such as aspirin and ibuprofen. These medicines can thin your blood. Do not take these medicines unless your doctor tells you to take them. °· If you are taking prescription pain medicine, take actions to prevent or treat constipation. Your doctor may recommend that you: °? Drink enough fluid to keep your pee (urine) clear or pale yellow. °? Take over-the-counter or prescription medicines. °? Eat foods that are high in fiber, such as fresh fruits and vegetables, whole grains, and beans. °? Limit foods that are high in fat and processed sugars, such as fried and sweet foods. °Caring for your cut °· Follow instructions from your doctor about how to take care of your cuts from surgery (incisions). Make sure you: °? Wash your hands with soap and water before you change your bandage (dressing). If you cannot use soap and water, use hand sanitizer. °? Change your bandage as told by your doctor. °? Leave stitches (sutures), skin glue, or skin tape (adhesive) strips in place. They may need to stay in place for 2 weeks or longer. If tape strips get loose and curl up, you may trim the loose edges. Do not remove tape strips completely unless your doctor says it is  okay. °· Check your cuts every day for signs of infection. Check for: °? Redness, swelling, or more pain. °? Fluid or blood. °? Pus or a bad smell. °? Warmth. °· Do not take baths, swim, or use a hot tub until your doctor says it is okay to do so. °Activity ° °· Rest at home for 1-2 days or as told by your doctor. °? Avoid sitting for a long time without moving. Get up to take short walks every 1-2 hours. °· Return to your normal activities as told by your doctor. Ask what activities are safe for you. °· Do not do these things in the first 24 hours: °? Drive. °? Use machinery. °? Take a bath or shower. °· Do not lift more than 10 pounds (4.5 kg) or play contact sports for the first 2 weeks. °General instructions ° °· Do not drink alcohol in the first week after the procedure. °· Have someone stay with you for at least 24 hours after the procedure. °· Get your test results. Ask your doctor or the department that is doing the test: °? When will my results be ready? °? How will I get my results? °? What are my treatment options? °? What other tests do I need? °? What are my next steps? °· Keep all follow-up visits as told by your doctor. This is important. °Contact a doctor if: °· A cut bleeds and leaves more than just a small spot of blood. °· A cut is red, puffs up (  swells), or hurts more than before. °· Fluid or something else comes from a cut. °· A cut smells bad. °· You have a fever or chills. °Get help right away if: °· You have swelling, bloating, or pain in your belly (abdomen). °· You get dizzy or faint. °· You have a rash. °· You feel sick to your stomach (nauseous) or throw up (vomit). °· You have trouble breathing, feel short of breath, or feel faint. °· Your chest hurts. °· You have problems talking or seeing. °· You have trouble with your balance or moving your arms or legs. °Summary °· After the procedure, it is common to have pain, soreness, bruising, and tiredness. °· Your doctor will tell you how to  take care of yourself at home. Change your bandage, take your medicines, and limit your activities as told by your doctor. °· Call your doctor if you have symptoms of infection. Get help right away if your belly swells, your cut bleeds a lot, or you have trouble talking or breathing. °This information is not intended to replace advice given to you by your health care provider. Make sure you discuss any questions you have with your health care provider. °Document Released: 03/21/2008 Document Revised: 06/22/2017 Document Reviewed: 06/22/2017 °Elsevier Interactive Patient Education © 2019 [employer] °Moderate Conscious Sedation, Adult, Care After °These instructions provide you with information about caring for yourself after your procedure. Your health care provider may also give you more specific instructions. Your treatment has been planned according to current medical practices, but problems sometimes occur. Call your health care provider if you have any problems or questions after your procedure. °What can I expect after the procedure? °After your procedure, it is common: °· To feel sleepy for several hours. °· To feel clumsy and have poor balance for several hours. °· To have poor judgment for several hours. °· To vomit if you eat too soon. °Follow these instructions at home: °For at least 24 hours after the procedure: ° °· Do not: °? Participate in activities where you could fall or become injured. °? Drive. °? Use heavy machinery. °? Drink alcohol. °? Take sleeping pills or medicines that cause drowsiness. °? Make important decisions or sign legal documents. °? Take care of children on your own. °· Rest. °Eating and drinking °· Follow the diet recommended by your health care provider. °· If you vomit: °? Drink water, juice, or soup when you can drink without vomiting. °? Make sure you have little or no nausea before eating solid foods. °General instructions °· Have a responsible adult stay with you until  you are awake and alert. °· Take over-the-counter and prescription medicines only as told by your health care provider. °· If you smoke, do not smoke without supervision. °· Keep all follow-up visits as told by your health care provider. This is important. °Contact a health care provider if: °· You keep feeling nauseous or you keep vomiting. °· You feel light-headed. °· You develop a rash. °· You have a fever. °Get help right away if: °· You have trouble breathing. °This information is not intended to replace advice given to you by your health care provider. Make sure you discuss any questions you have with your health care provider. °Document Released: 04/02/2013 Document Revised: 11/15/2015 Document Reviewed: 10/02/2015 °Elsevier Interactive Patient Education © 2019 [employer] ° °

## 2018-08-20 ENCOUNTER — Other Ambulatory Visit (HOSPITAL_COMMUNITY): Payer: Self-pay | Admitting: *Deleted

## 2018-08-21 ENCOUNTER — Ambulatory Visit (HOSPITAL_COMMUNITY)
Admission: RE | Admit: 2018-08-21 | Discharge: 2018-08-21 | Disposition: A | Discharge status: Home / Self Care | Payer: 59 | Source: Ambulatory Visit | Attending: Gastroenterology | Admitting: Gastroenterology

## 2018-08-21 NOTE — Progress Notes (Signed)
Pt here today for phlebotomy.  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  I had patient completely flat in bed, wife held his other hand and was able to phlebotomize 350cc of blood per MD order.  During procedure he felt like he was going to pass out and got nauseous but without vomitting as we reached 300cc.  I asked him if he wanted me to stop and he stated he was okay to keep going and he was able to tolerate the 350cc  Phlebotomy.  BP remained elevated after procedure but was better then pre-procedure.  I offered to have the wife go get the car and take him to the front in a wheelchair but he insisted he was okay to walk out with her.  He got up slowly to a sitting position for a few minutes before standing and was stable on his feet and said he felt ok.  I walked them both out into the hallway and showed them how to get out of the hospital and asked if he needed me to walk him the whole way to the entrance and he stated no.

## 2018-09-11 ENCOUNTER — Other Ambulatory Visit: Payer: Self-pay

## 2018-09-11 ENCOUNTER — Ambulatory Visit (HOSPITAL_COMMUNITY)
Admission: RE | Admit: 2018-09-11 | Discharge: 2018-09-11 | Disposition: A | Discharge status: Home / Self Care | Payer: 59 | Source: Ambulatory Visit | Attending: Gastroenterology | Admitting: Gastroenterology

## 2018-09-30 ENCOUNTER — Other Ambulatory Visit: Payer: Self-pay

## 2018-09-30 ENCOUNTER — Other Ambulatory Visit (HOSPITAL_COMMUNITY): Payer: Self-pay | Admitting: *Deleted

## 2018-10-01 ENCOUNTER — Ambulatory Visit (HOSPITAL_COMMUNITY)
Admission: RE | Admit: 2018-10-01 | Discharge: 2018-10-01 | Disposition: A | Discharge status: Home / Self Care | Payer: 59 | Source: Ambulatory Visit | Attending: Gastroenterology | Admitting: Gastroenterology

## 2018-10-01 ENCOUNTER — Other Ambulatory Visit: Payer: Self-pay

## 2018-10-02 ENCOUNTER — Encounter (HOSPITAL_COMMUNITY): Payer: 59

## 2018-10-21 IMAGING — CR DG CHEST 2V
2 series · 2 of 2 positions shown · non-contrast
Comparison: 02/13/2013

CLINICAL DATA: Shortness of breath for 45 minutes

EXAM:
CHEST  2 VIEW

[w chest pa]
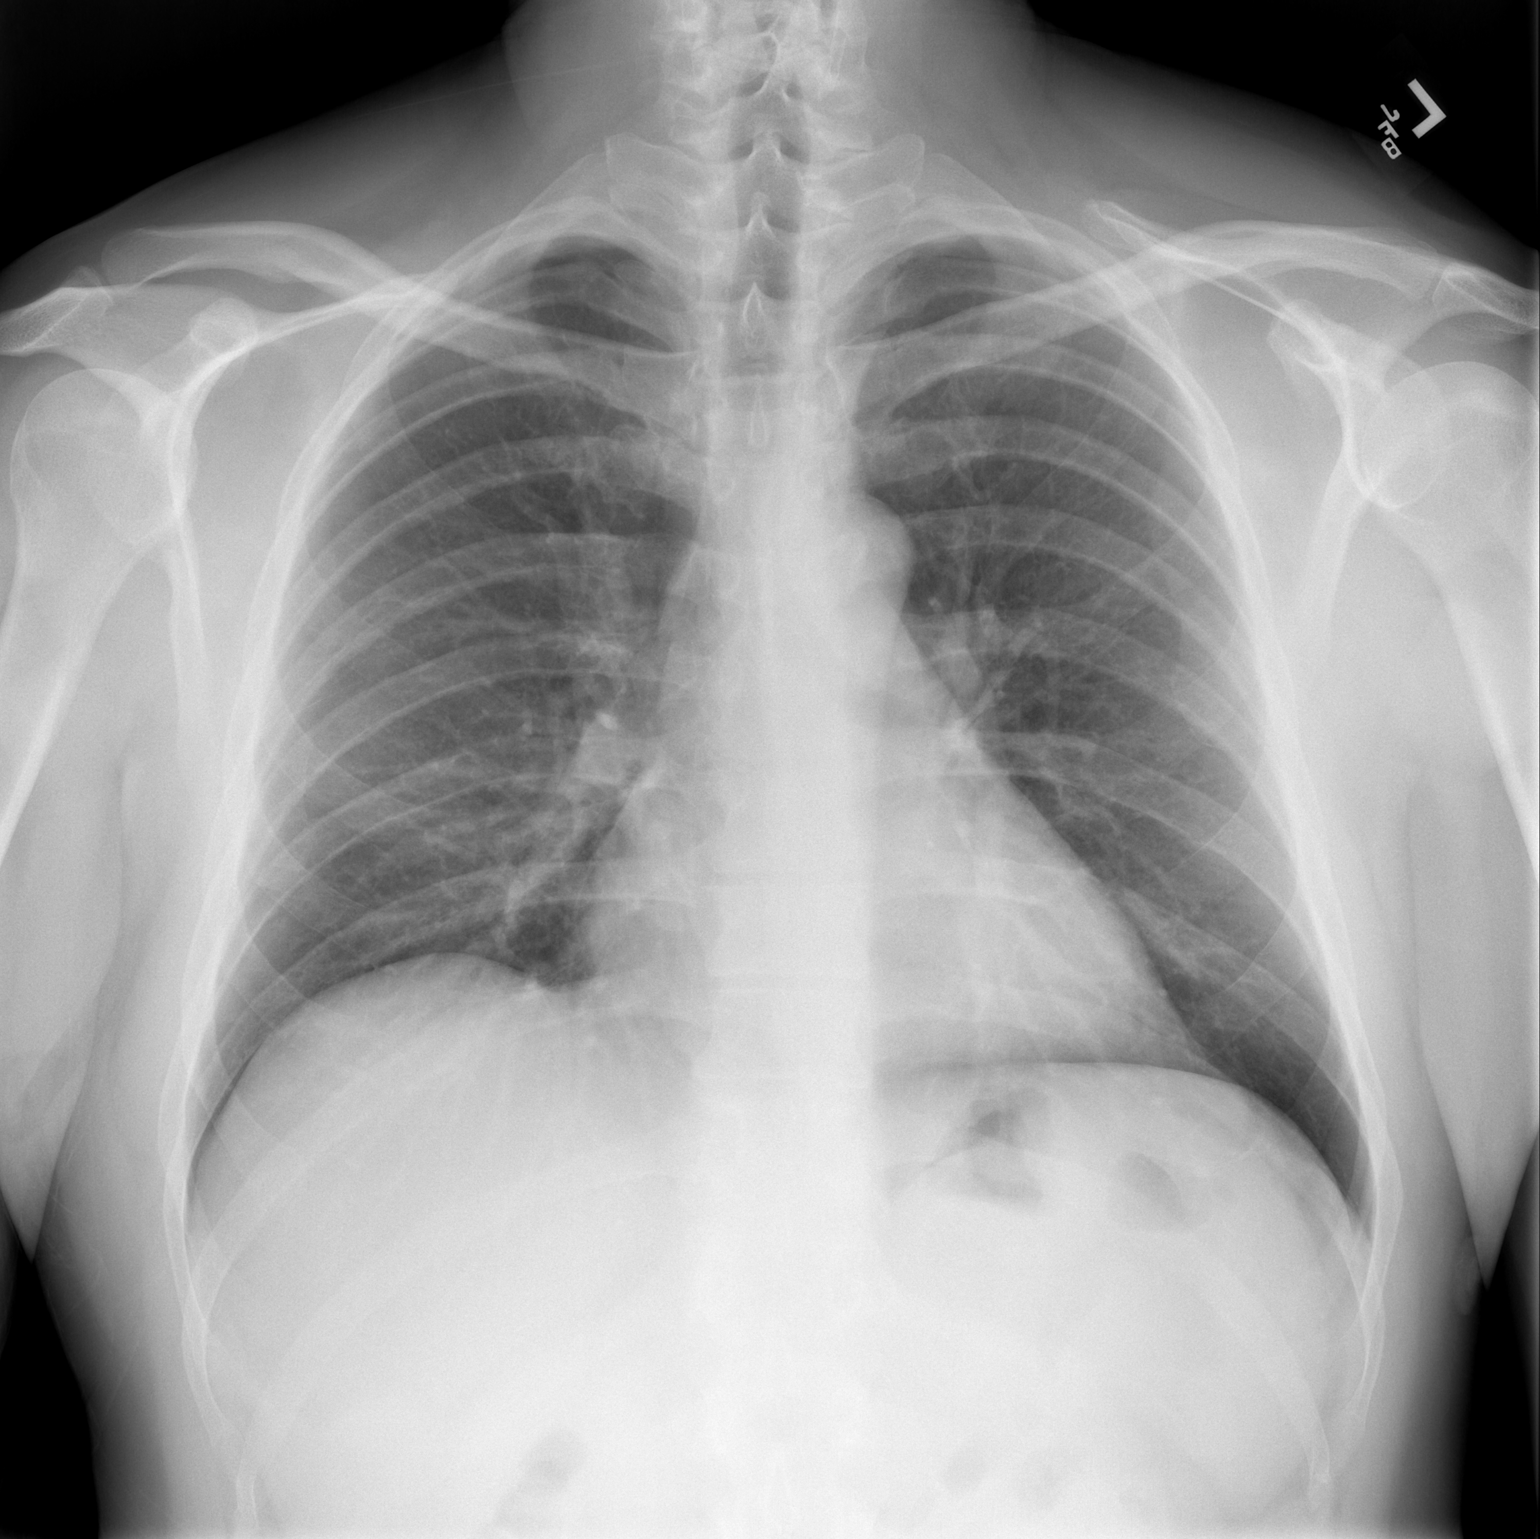

[w chest lat]
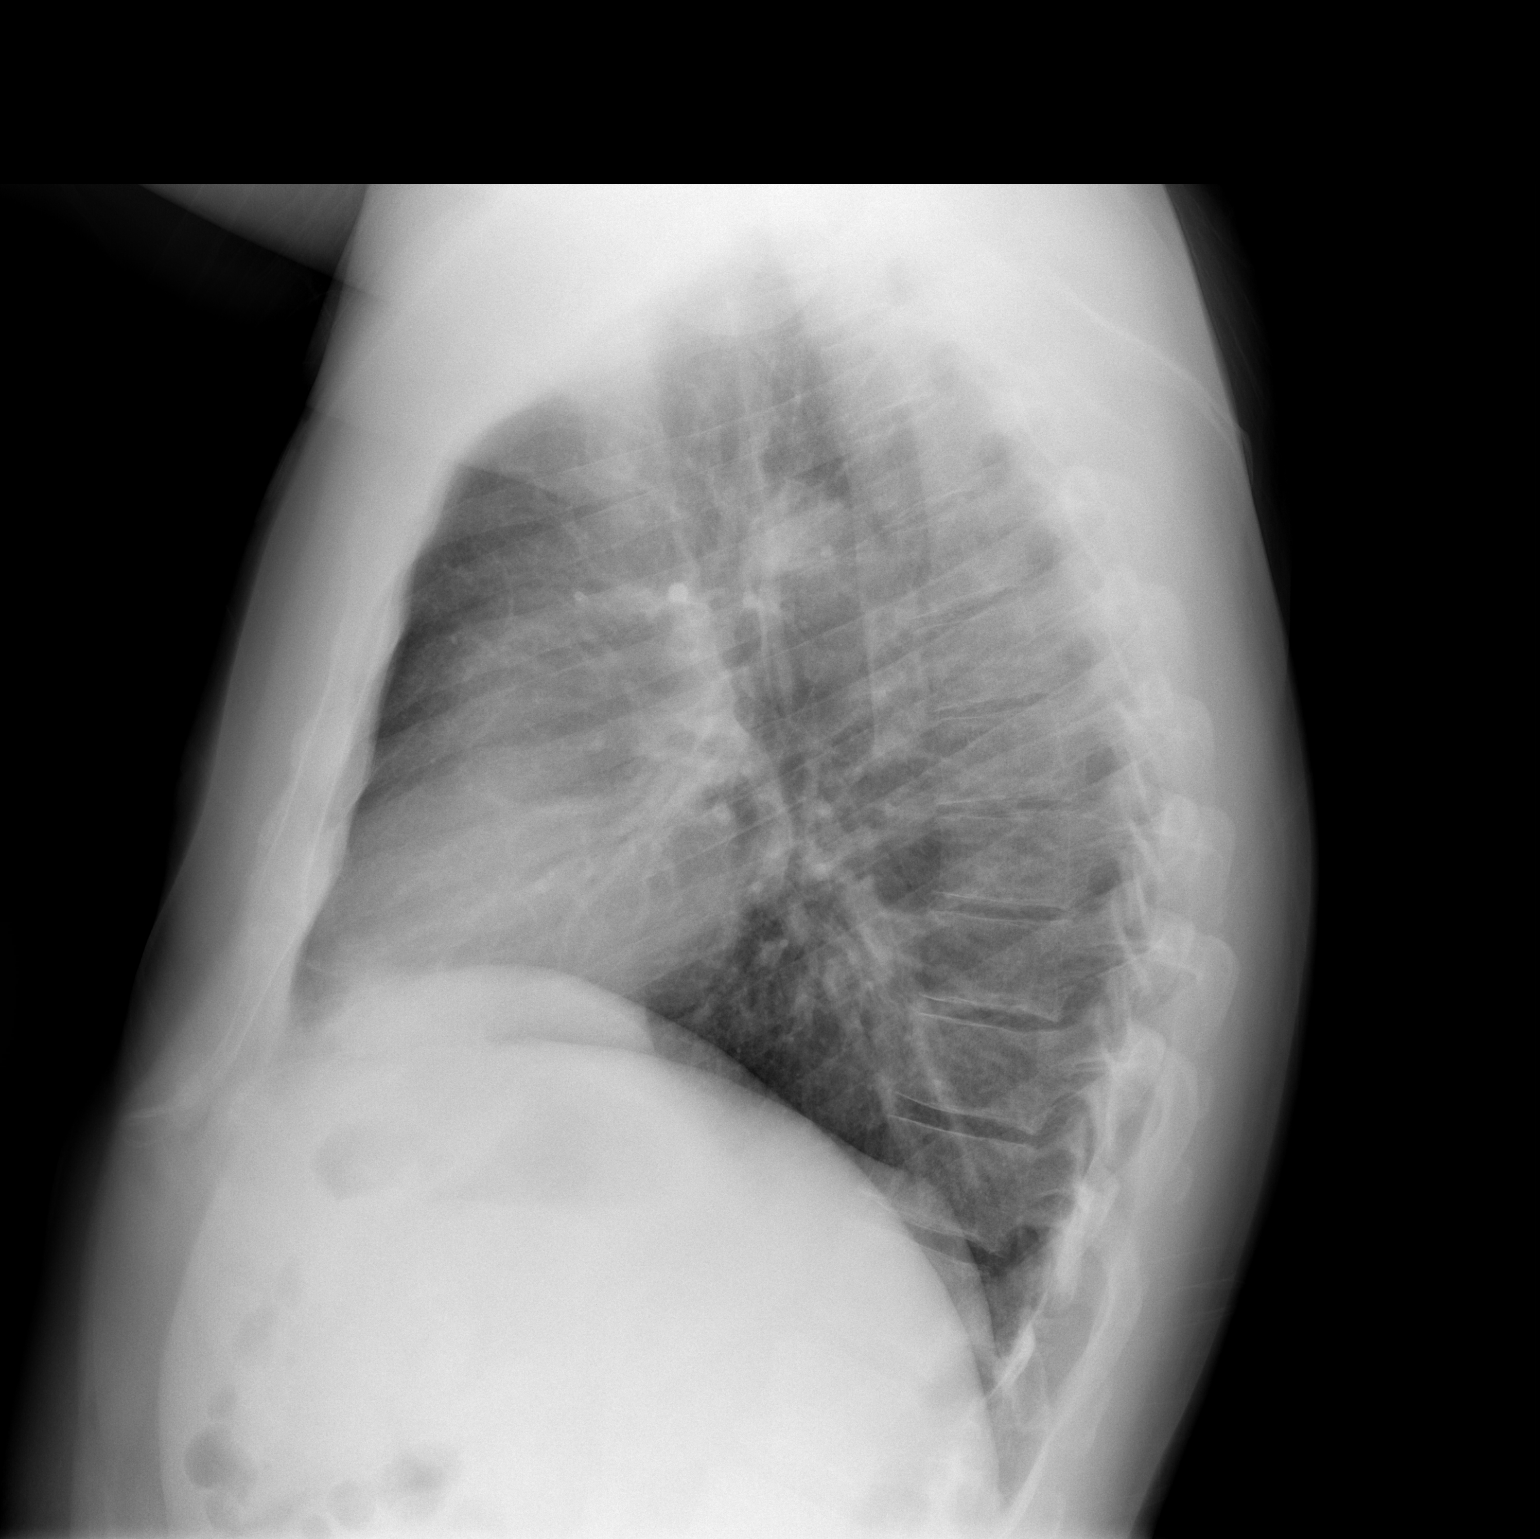

[2 of 2 positions shown; findings below may reference images not displayed]

FINDINGS: Normal heart size and mediastinal contours. No acute infiltrate or
edema. No effusion or pneumothorax. No acute osseous findings.
IMPRESSION: Negative chest.

## 2018-10-29 ENCOUNTER — Encounter (HOSPITAL_COMMUNITY): Payer: 59

## 2018-11-05 ENCOUNTER — Other Ambulatory Visit: Payer: Self-pay

## 2018-11-05 ENCOUNTER — Encounter (HOSPITAL_COMMUNITY)
Admission: RE | Admit: 2018-11-05 | Discharge: 2018-11-05 | Disposition: A | Discharge status: Home / Self Care | Payer: 59 | Source: Ambulatory Visit | Attending: Gastroenterology | Admitting: Gastroenterology

## 2018-12-06 ENCOUNTER — Other Ambulatory Visit (HOSPITAL_COMMUNITY): Payer: Self-pay | Admitting: *Deleted

## 2018-12-06 ENCOUNTER — Encounter (HOSPITAL_COMMUNITY): Payer: 59

## 2018-12-09 ENCOUNTER — Other Ambulatory Visit: Payer: Self-pay

## 2018-12-09 ENCOUNTER — Ambulatory Visit (HOSPITAL_COMMUNITY)
Admission: RE | Admit: 2018-12-09 | Discharge: 2018-12-09 | Disposition: A | Discharge status: Home / Self Care | Payer: 59 | Source: Ambulatory Visit | Attending: Gastroenterology | Admitting: Gastroenterology

## 2018-12-09 NOTE — Progress Notes (Signed)
Called to verify with office that Hgb of 14.5 on 11/26/18 was acceptable to use for phlebotomy.  Patient tolerated right AC phlebotomy.  566mL of blood waste removed.  Vital signs stable.  Patient to be discharged.

## 2019-01-06 ENCOUNTER — Ambulatory Visit (HOSPITAL_COMMUNITY)
Admission: RE | Admit: 2019-01-06 | Discharge: 2019-01-06 | Disposition: A | Discharge status: Home / Self Care | Payer: 59 | Source: Ambulatory Visit | Attending: Gastroenterology | Admitting: Gastroenterology

## 2019-01-06 ENCOUNTER — Other Ambulatory Visit: Payer: Self-pay

## 2019-01-06 LAB — POCT HEMOGLOBIN-HEMACUE: Hemoglobin: 12.1 g/dL — ABNORMAL LOW (ref 13.0–17.0)

## 2019-02-03 ENCOUNTER — Encounter (HOSPITAL_COMMUNITY): Payer: 59

## 2019-02-10 ENCOUNTER — Encounter (HOSPITAL_COMMUNITY): Payer: 59

## 2019-02-17 ENCOUNTER — Ambulatory Visit (HOSPITAL_COMMUNITY)
Admission: RE | Admit: 2019-02-17 | Discharge: 2019-02-17 | Disposition: A | Discharge status: Home / Self Care | Payer: 59 | Source: Ambulatory Visit | Attending: Gastroenterology | Admitting: Gastroenterology

## 2019-02-17 LAB — POCT HEMOGLOBIN-HEMACUE: Hemoglobin: 15.6 g/dL (ref 13.0–17.0)

## 2019-03-17 ENCOUNTER — Other Ambulatory Visit: Payer: Self-pay

## 2019-03-17 ENCOUNTER — Ambulatory Visit (HOSPITAL_COMMUNITY)
Admission: RE | Admit: 2019-03-17 | Discharge: 2019-03-17 | Disposition: A | Discharge status: Home / Self Care | Payer: 59 | Source: Ambulatory Visit | Attending: Gastroenterology | Admitting: Gastroenterology

## 2019-03-17 LAB — POCT HEMOGLOBIN-HEMACUE: Hemoglobin: 14 g/dL (ref 13.0–17.0)

## 2019-04-14 ENCOUNTER — Other Ambulatory Visit: Payer: Self-pay

## 2019-04-14 ENCOUNTER — Ambulatory Visit (HOSPITAL_COMMUNITY)
Admission: RE | Admit: 2019-04-14 | Discharge: 2019-04-14 | Disposition: A | Discharge status: Home / Self Care | Payer: 59 | Source: Ambulatory Visit | Attending: Gastroenterology | Admitting: Gastroenterology

## 2019-04-14 LAB — POCT HEMOGLOBIN-HEMACUE: Hemoglobin: 14.3 g/dL (ref 13.0–17.0)

## 2019-05-26 ENCOUNTER — Other Ambulatory Visit: Payer: Self-pay

## 2019-05-26 ENCOUNTER — Ambulatory Visit (HOSPITAL_COMMUNITY)
Admission: RE | Admit: 2019-05-26 | Discharge: 2019-05-26 | Disposition: A | Discharge status: Home / Self Care | Payer: 59 | Source: Ambulatory Visit | Attending: Gastroenterology | Admitting: Gastroenterology

## 2019-05-26 LAB — POCT HEMOGLOBIN-HEMACUE: Hemoglobin: 15.2 g/dL (ref 13.0–17.0)

## 2019-06-23 ENCOUNTER — Encounter (HOSPITAL_COMMUNITY): Payer: 59

## 2020-01-20 IMAGING — US US BIOPSY CORE LIVER
1 series · 8 of 8 positions shown · non-contrast
Comparison: none

CLINICAL DATA: Abnormal LFTs.  Possible hemochromatosis.

EXAM:
ULTRASOUND-GUIDED CORE LIVER BIOPSY
TECHNIQUE: An ultrasound guided liver biopsy was thoroughly discussed with the
patient and questions were answered. The benefits, risks,
alternatives, and complications were also discussed. The patient
understands and wishes to proceed with the procedure. A verbal as
well as written consent was obtained.

[Series 1: us biopsy core liver · 8 of 8 slices shown]
[im 1/8]
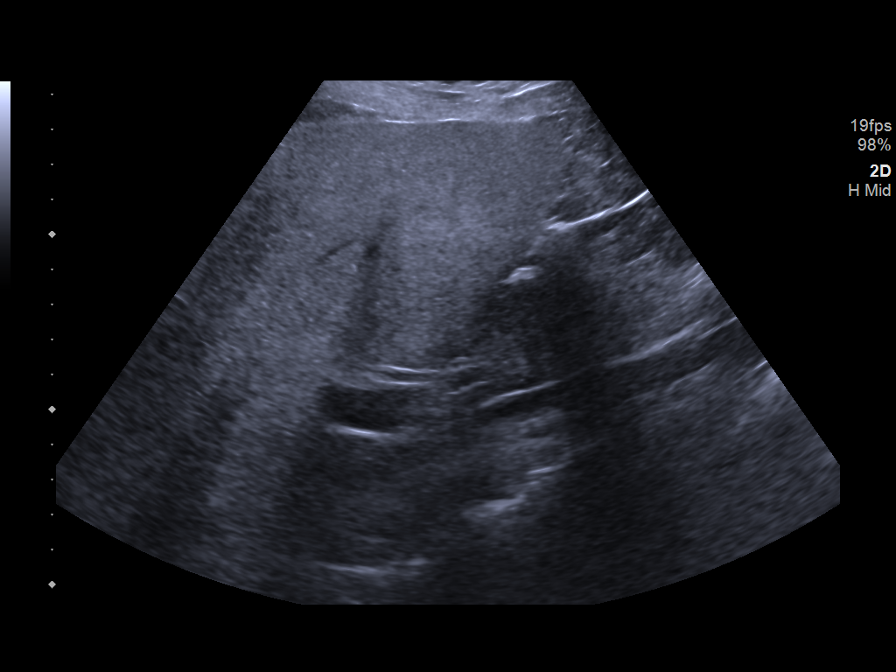
[im 2/8]
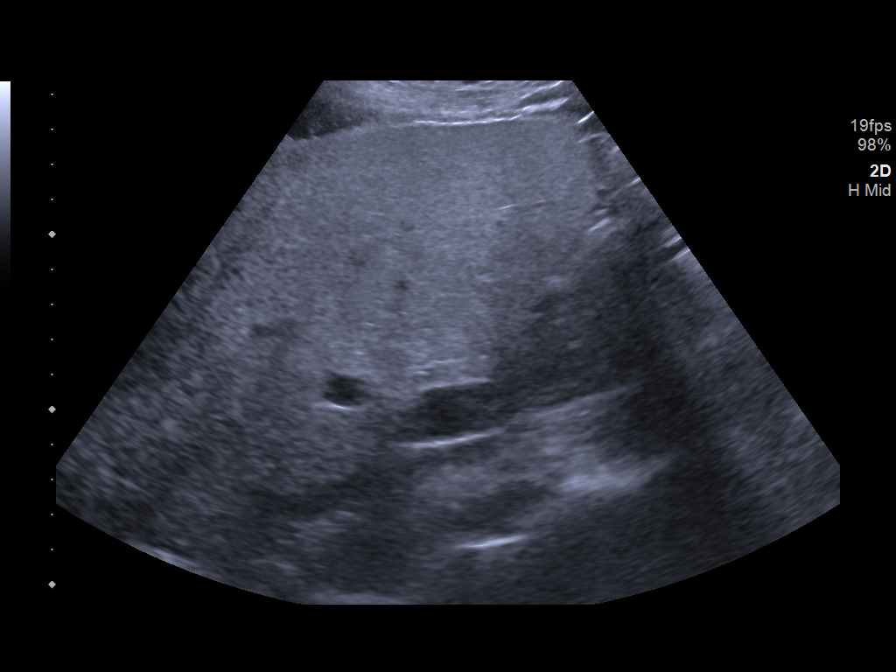
[im 3/8]
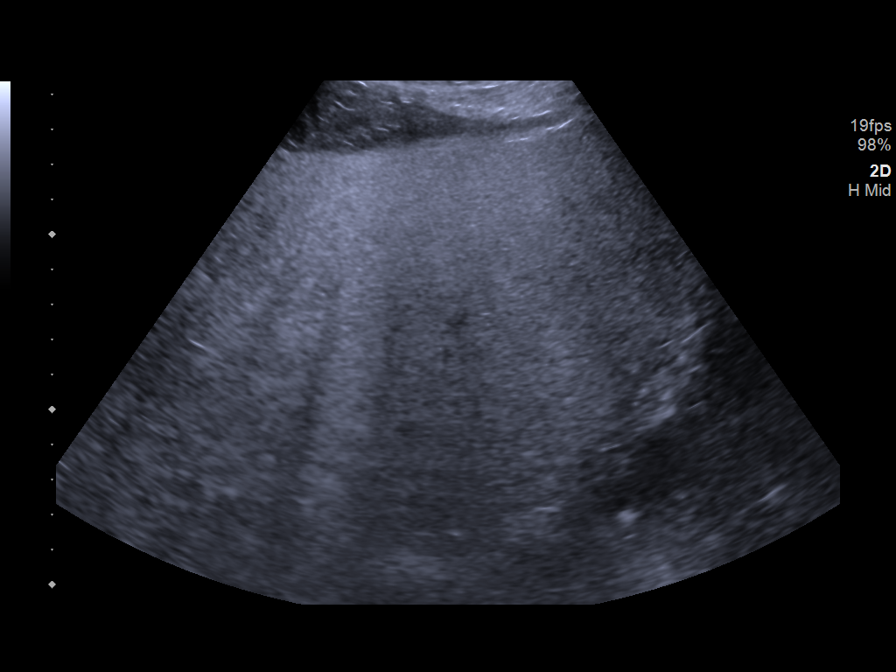
[im 4/8]
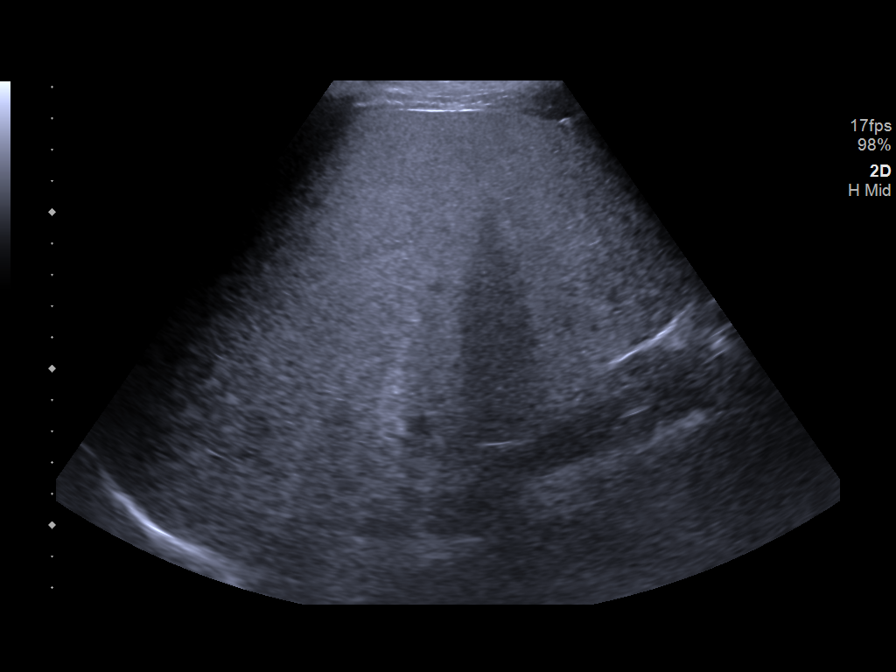
[im 5/8]
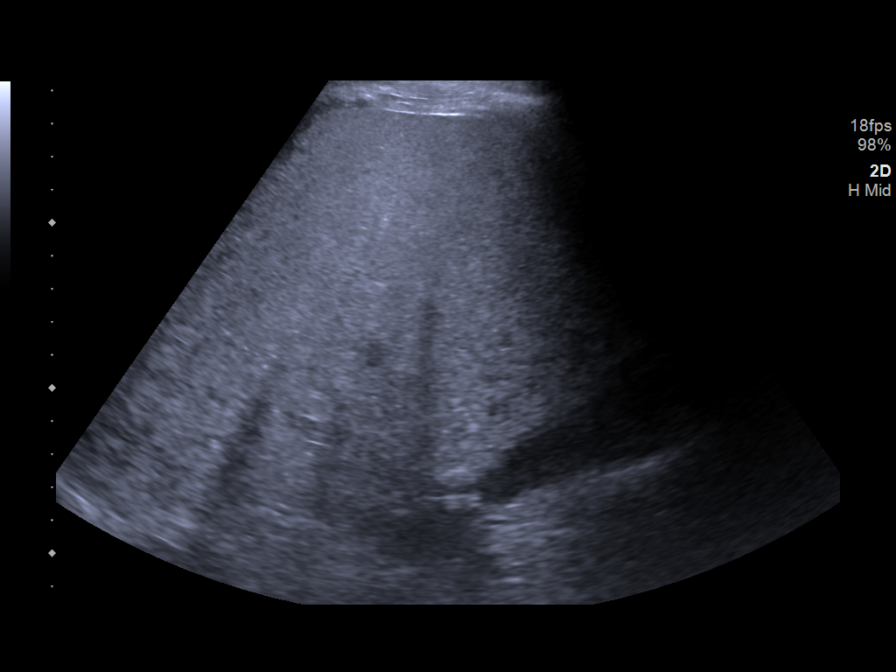
[im 6/8]
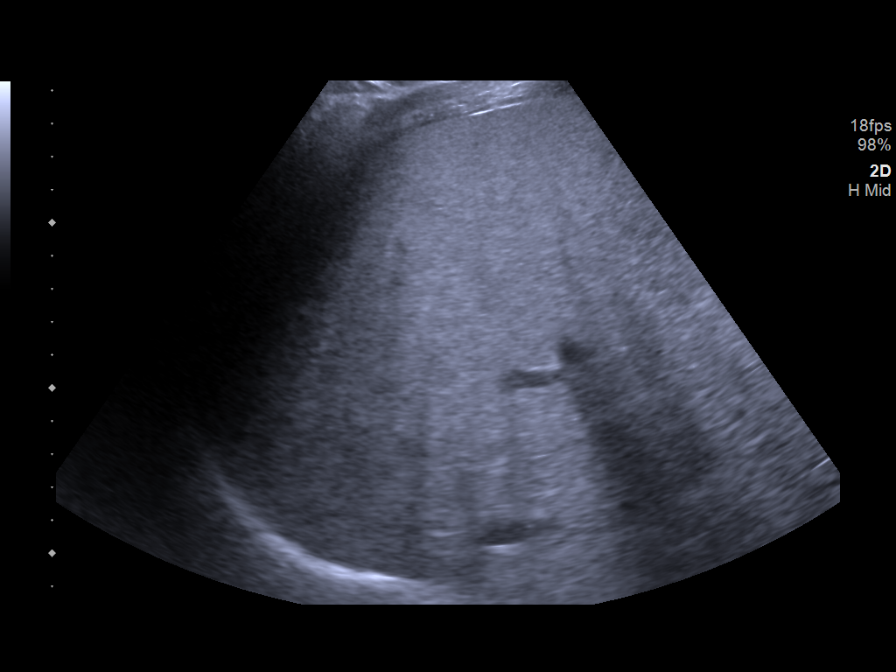
[im 7/8]
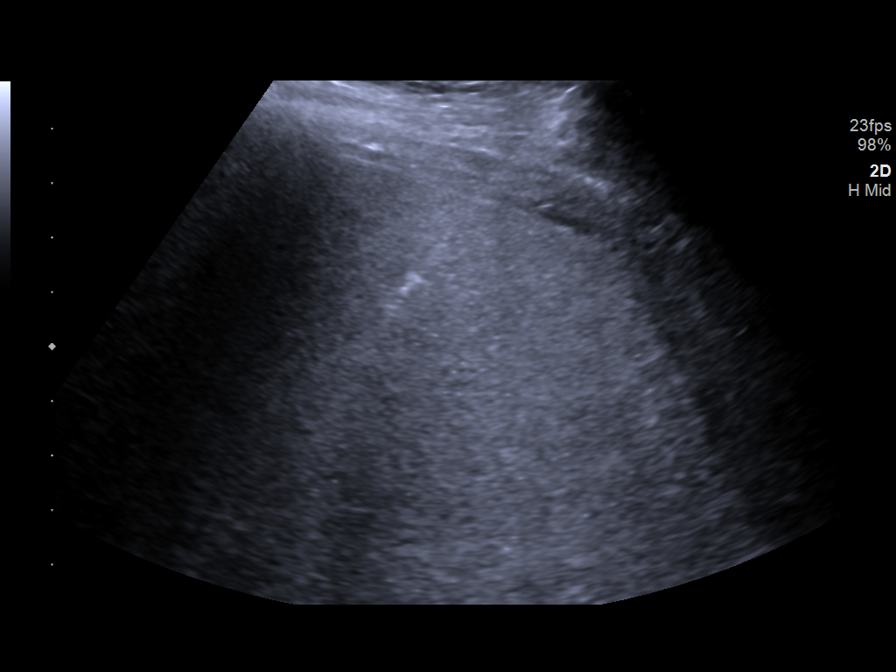
[im 8/8]
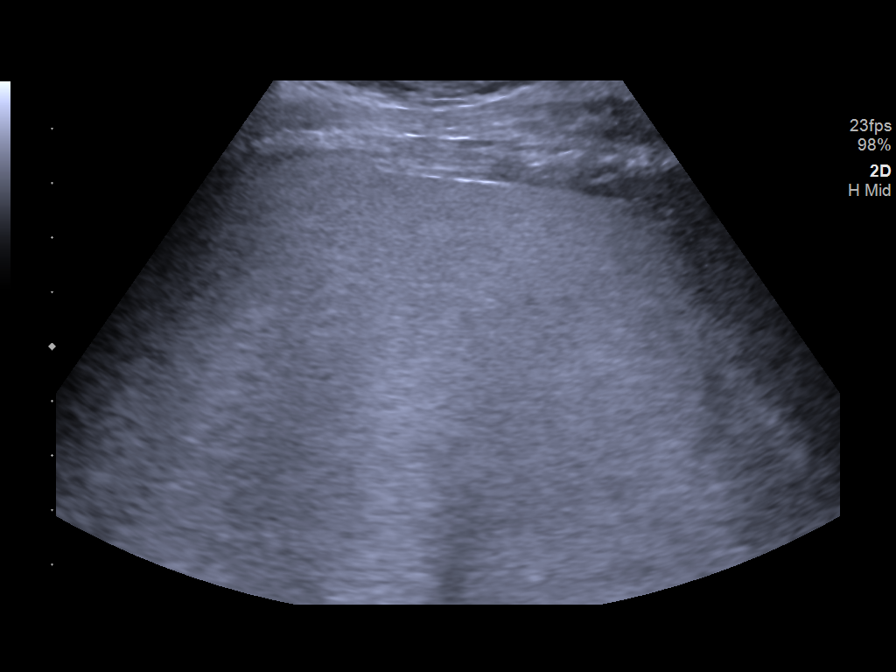

[8 of 8 positions shown; findings below may reference images not displayed]

Survey ultrasound of the liver was performed and an appropriate skin
entry site was determined. Skin site was marked, prepped with
chlorhexidine, and draped in usual sterile fashion, and infiltrated
locally with 1% lidocaine.

Intravenous Fentanyl and Versed were administered as conscious
sedation during continuous monitoring of the patient's level of
consciousness and physiological / cardiorespiratory status by the
radiology RN, with a total moderate sedation time of 10 minutes.

A 17 gauge trocar needle was advanced under ultrasound guidance into
the liver. 3 coaxial 78gauge core samples were then obtained through
the guide needle. The guide needle was removed. Post procedure scans
demonstrate no apparent complication.

COMPLICATIONS:
COMPLICATIONS
None immediate
FINDINGS: Survey ultrasound demonstrates echogenic liver without focal lesion
or biliary ductal dilatation. Representative core biopsy samples
obtained as above.
IMPRESSION: 1. Technically successful ultrasound guided core liver biopsy.

## 2021-10-04 DIAGNOSIS — M25561 Pain in right knee: Secondary | ICD-10-CM | POA: Diagnosis not present

## 2021-10-10 ENCOUNTER — Other Ambulatory Visit: Payer: Self-pay | Admitting: Sports Medicine

## 2021-10-10 ENCOUNTER — Ambulatory Visit
Admission: RE | Admit: 2021-10-10 | Discharge: 2021-10-10 | Disposition: A | Discharge status: Home / Self Care | Payer: BC Managed Care – PPO | Source: Ambulatory Visit | Attending: Sports Medicine | Admitting: Sports Medicine

## 2021-10-10 DIAGNOSIS — M25561 Pain in right knee: Secondary | ICD-10-CM

## 2021-10-10 DIAGNOSIS — M7989 Other specified soft tissue disorders: Secondary | ICD-10-CM | POA: Diagnosis not present

## 2021-11-25 DIAGNOSIS — Z13220 Encounter for screening for lipoid disorders: Secondary | ICD-10-CM | POA: Diagnosis not present

## 2021-11-25 DIAGNOSIS — Z125 Encounter for screening for malignant neoplasm of prostate: Secondary | ICD-10-CM | POA: Diagnosis not present

## 2021-11-25 DIAGNOSIS — G4700 Insomnia, unspecified: Secondary | ICD-10-CM | POA: Diagnosis not present

## 2021-11-25 DIAGNOSIS — E781 Pure hyperglyceridemia: Secondary | ICD-10-CM | POA: Diagnosis not present

## 2021-11-25 DIAGNOSIS — M179 Osteoarthritis of knee, unspecified: Secondary | ICD-10-CM | POA: Diagnosis not present

## 2021-11-25 DIAGNOSIS — Z0000 Encounter for general adult medical examination without abnormal findings: Secondary | ICD-10-CM | POA: Diagnosis not present

## 2021-11-25 DIAGNOSIS — I10 Essential (primary) hypertension: Secondary | ICD-10-CM | POA: Diagnosis not present

## 2021-11-29 ENCOUNTER — Other Ambulatory Visit: Payer: Self-pay | Admitting: Family Medicine

## 2021-11-29 DIAGNOSIS — I10 Essential (primary) hypertension: Secondary | ICD-10-CM

## 2021-11-29 DIAGNOSIS — Z8249 Family history of ischemic heart disease and other diseases of the circulatory system: Secondary | ICD-10-CM

## 2021-12-15 ENCOUNTER — Ambulatory Visit
Admission: RE | Admit: 2021-12-15 | Discharge: 2021-12-15 | Disposition: A | Discharge status: Home / Self Care | Payer: BC Managed Care – PPO | Source: Ambulatory Visit | Attending: Family Medicine | Admitting: Family Medicine

## 2021-12-15 DIAGNOSIS — I10 Essential (primary) hypertension: Secondary | ICD-10-CM

## 2021-12-15 DIAGNOSIS — Z8249 Family history of ischemic heart disease and other diseases of the circulatory system: Secondary | ICD-10-CM

## 2021-12-15 DIAGNOSIS — Z8679 Personal history of other diseases of the circulatory system: Secondary | ICD-10-CM | POA: Diagnosis not present

## 2022-06-08 DIAGNOSIS — D122 Benign neoplasm of ascending colon: Secondary | ICD-10-CM | POA: Diagnosis not present

## 2022-06-08 DIAGNOSIS — K648 Other hemorrhoids: Secondary | ICD-10-CM | POA: Diagnosis not present

## 2022-06-08 DIAGNOSIS — Z1211 Encounter for screening for malignant neoplasm of colon: Secondary | ICD-10-CM | POA: Diagnosis not present

## 2022-06-08 DIAGNOSIS — K5730 Diverticulosis of large intestine without perforation or abscess without bleeding: Secondary | ICD-10-CM | POA: Diagnosis not present

## 2022-06-08 DIAGNOSIS — D124 Benign neoplasm of descending colon: Secondary | ICD-10-CM | POA: Diagnosis not present

## 2022-06-08 DIAGNOSIS — D123 Benign neoplasm of transverse colon: Secondary | ICD-10-CM | POA: Diagnosis not present

## 2022-09-04 DIAGNOSIS — L0202 Furuncle of face: Secondary | ICD-10-CM | POA: Diagnosis not present

## 2022-09-04 DIAGNOSIS — B958 Unspecified staphylococcus as the cause of diseases classified elsewhere: Secondary | ICD-10-CM | POA: Diagnosis not present

## 2022-09-04 DIAGNOSIS — L820 Inflamed seborrheic keratosis: Secondary | ICD-10-CM | POA: Diagnosis not present

## 2022-09-04 DIAGNOSIS — D2361 Other benign neoplasm of skin of right upper limb, including shoulder: Secondary | ICD-10-CM | POA: Diagnosis not present

## 2022-11-27 DIAGNOSIS — Z125 Encounter for screening for malignant neoplasm of prostate: Secondary | ICD-10-CM | POA: Diagnosis not present

## 2022-11-27 DIAGNOSIS — Z0000 Encounter for general adult medical examination without abnormal findings: Secondary | ICD-10-CM | POA: Diagnosis not present

## 2022-11-27 DIAGNOSIS — Z13220 Encounter for screening for lipoid disorders: Secondary | ICD-10-CM | POA: Diagnosis not present

## 2022-12-05 DIAGNOSIS — Z0000 Encounter for general adult medical examination without abnormal findings: Secondary | ICD-10-CM | POA: Diagnosis not present

## 2022-12-05 DIAGNOSIS — I10 Essential (primary) hypertension: Secondary | ICD-10-CM | POA: Diagnosis not present

## 2022-12-05 DIAGNOSIS — R002 Palpitations: Secondary | ICD-10-CM | POA: Diagnosis not present

## 2022-12-05 DIAGNOSIS — G4700 Insomnia, unspecified: Secondary | ICD-10-CM | POA: Diagnosis not present

## 2023-02-09 ENCOUNTER — Ambulatory Visit: Payer: BC Managed Care – PPO | Attending: Cardiology | Admitting: Cardiology

## 2023-02-09 ENCOUNTER — Encounter: Payer: Self-pay | Admitting: Cardiology

## 2023-02-09 VITALS — BP 144/88 | HR 86 | Ht 71.0 in | Wt 184.4 lb

## 2023-02-09 DIAGNOSIS — I10 Essential (primary) hypertension: Secondary | ICD-10-CM | POA: Diagnosis not present

## 2023-02-09 DIAGNOSIS — Z01812 Encounter for preprocedural laboratory examination: Secondary | ICD-10-CM

## 2023-02-09 DIAGNOSIS — R079 Chest pain, unspecified: Secondary | ICD-10-CM

## 2023-02-09 DIAGNOSIS — R0602 Shortness of breath: Secondary | ICD-10-CM | POA: Diagnosis not present

## 2023-02-09 MED ORDER — METOPROLOL TARTRATE 100 MG PO TABS: 100.0000 mg | ORAL_TABLET | ORAL | 0 refills | Status: DC

## 2023-02-09 NOTE — Patient Instructions (Addendum)
Medication Instructions:  The current medical regimen is effective;  continue present plan and medications.  *If you need a refill on your cardiac medications before your next appointment, please call your pharmacy*   Lab Work: Please have blood work today (BMP)  If you have labs (blood work) drawn today and your tests are completely normal, you will receive your results only by: MyChart Message (if you have MyChart) OR A paper copy in the mail If you have any lab test that is abnormal or we need to change your treatment, we will call you to review the results.   Testing/Procedures: Your physician has requested that you have an echocardiogram. Echocardiography is a painless test that uses sound waves to create images of your heart. It provides your doctor with information about the size and shape of your heart and how well your heart's chambers and valves are working. This procedure takes approximately one hour. There are no restrictions for this procedure. Please do NOT wear cologne, perfume, aftershave, or lotions (deodorant is allowed). Please arrive 15 minutes prior to your appointment time.    Your cardiac CT will be scheduled at:   [facility] [address] [place], [place] [address] [phone]  Please arrive at the [facility] and Children's Entrance (Entrance C2) of [facility] 30 minutes prior to test start time. You can use the FREE valet parking offered at entrance C (encouraged to control the heart rate for the test)  Proceed to the [facility] Radiology Department (first floor) to check-in and test prep.  All radiology patients and guests should use entrance C2 at [facility], accessed from [facility], even though the hospital's physical address listed is [address]    Please follow these instructions carefully (unless otherwise directed):  An IV will be required for this test and Nitroglycerin will be given.   Hold all erectile dysfunction medications at least 3 days (72 hrs) prior to test. (Ie viagra, cialis, sildenafil, tadalafil, etc)   On the Night Before the Test: Be sure to Drink plenty of water. Do not consume any caffeinated/decaffeinated beverages or chocolate 12 hours prior to your test. Do not take any antihistamines 12 hours prior to your test.  On the Day of the Test: Drink plenty of water until 1 hour prior to the test. Do not eat any food 1 hour prior to test. You may take your regular medications prior to the test.  Take metoprolol (Lopressor) two hours prior to test.  After the Test: Drink plenty of water. After receiving IV contrast, you may experience a mild flushed feeling. This is normal. On occasion, you may experience a mild rash up to 24 hours after the test. This is not dangerous. If this occurs, you can take Benadryl 25 mg and increase your fluid intake. If you experience trouble breathing, this can be serious. If it is severe call 911 IMMEDIATELY. If it is mild, please call our office.  We will call to schedule your test 2-4 weeks out understanding that some insurance companies will need an authorization prior to the service being performed.   For more information and frequently asked questions, please visit our website : [email or web address]  For non-scheduling related questions, please contact the cardiac imaging nurse navigator should you have any questions/concerns: Cardiac Imaging Nurse Navigators Direct Office Dial: [phone]   For scheduling needs, including cancellations and rescheduling, please call [place], [phone].   Follow-Up: At [facility], you and  your health needs are our priority.  As part of our continuing mission to provide you with exceptional heart care, we have created designated Provider Care Teams.  These Care Teams include your primary Cardiologist (physician) and Advanced Practice Providers (APPs -   Physician Assistants and Nurse Practitioners) who all work together to provide you with the care you need, when you need it.  We recommend signing up for the patient portal called "MyChart".  Sign up information is provided on this After Visit Summary.  MyChart is used to connect with patients for Virtual Visits (Telemedicine).  Patients are able to view lab/test results, encounter notes, upcoming appointments, etc.  Non-urgent messages can be sent to your provider as well.   To learn more about what you can do with MyChart, go to [email or web address].    Your next appointment:   Follow up will be based on the results of the above testing.

## 2023-02-09 NOTE — Progress Notes (Signed)
Cardiology Office Note:    Date:  02/09/2023   ID:  Russell Rhodes, DOB 03/30/1971, MRN [id number]  PCP:  Daisy Floro, MD   [facility] Providers Cardiologist:  None    Referring MD: Daisy Floro, MD    History of Present Illness:    Discussed the use of AI scribe software for clinical note transcription with the patient, who gave verbal consent to proceed.  History of Present Illness   The patient, with a history of hemochromatosis and hypertension, presents with symptoms of chest pressure, fear of choking, and a heavy feeling in the chest. These symptoms have been occurring for the past six months and can last all day. The patient describes these symptoms as similar to a panic attack. The symptoms occur sporadically and can be present during activities such as driving. The patient also experiences shortness of breath. The patient is a former smoker and has been vaping for the past four years. 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 The patient has a family history of aortic aneurysm but has regular ultrasounds to monitor for this condition.       Past Medical History:  Diagnosis Date   Family history of abdominal aortic aneurysm (AAA)    Hemochromatosis    Hereditary hemochromatosis (HCC)    Hypertension    Hypertriglyceridemia    Insomnia    OA (osteoarthritis) of knee    Recovering alcoholic (HCC)    Thyroid disease    Vitamin D deficiency     Past Surgical History:  Procedure Laterality Date   EYE SURGERY  2008   lasik bilateral   PARATHYROIDECTOMY Left 02/20/2013   Procedure: left superior minimally invasive PARATHYROIDECTOMY ;  Surgeon: Velora Heckler, MD;  Location: [facility];  Service: General;  Laterality: Left;   VASECTOMY  2006    Current Medications: Current Meds  Medication Sig   amLODipine (NORVASC) 5 MG tablet Take 5 mg by mouth daily.   Cyanocobalamin (VITAMIN B 12 PO) Take  2,500 mg by mouth daily at 2 am.   Glucosamine Sulfate 1000 MG CAPS Take 1,000 mg by mouth in the morning and at bedtime.   irbesartan (AVAPRO) 300 MG tablet Take 300 mg by mouth daily.    Krill Oil 1000 MG CAPS Take 1 capsule by mouth daily.    metoprolol tartrate (LOPRESSOR) 100 MG tablet Take 1 tablet (100 mg total) by mouth as directed. Take one tablet 2 hours before CT scan   traZODone (DESYREL) 50 MG tablet Take 25 mg by mouth at bedtime.   triamcinolone cream (KENALOG) 0.1 % Apply 1 Application topically 2 (two) times daily.   Turmeric 500 MG CAPS Take by mouth.     Allergies:   Patient has no known allergies.   Social History   Socioeconomic History   Marital status: Married    Spouse name: Not on file   Number of children: Not on file   Years of education: Not on file   Highest education level: Not on file  Occupational History   Not on file  Tobacco Use   Smoking status: Former    Current packs/day: 0.00    Types: Cigarettes    Quit date: 01/22/2011    Years since quitting: 12.0   Smokeless tobacco: Never  Substance and Sexual Activity   Alcohol use: Yes    Comment: occassionally   Drug use: No   Sexual  activity: Not on file  Other Topics Concern   Not on file  Social History Narrative   Not on file   Social Determinants of Health   Financial Resource Strain: Not on file  Food Insecurity: Not on file  Transportation Needs: Not on file  Physical Activity: Not on file  Stress: Not on file  Social Connections: Not on file     Family History: The patient's family history includes Alcohol abuse in his brother, brother, and father; Heart disease in his father; Hemochromatosis in his mother; Hypertension in his father; Pancreatitis in his brother.  ROS:   Please see the history of present illness.    +CP, +SOB. All other systems reviewed and are negative.  EKGs/Labs/Other Studies Reviewed:    The following studies were reviewed today: LABS LDL  cholesterol: 106 (11/27/2022) Hemoglobin (Hb): 14.9 (11/27/2022) Creatinine (Cr): 1.03 (11/27/2022) Thyroid-stimulating hormone (TSH): 0.9 (11/27/2022) Triglycerides: 331 (11/27/2022)  EKG:  The ekg ordered today demonstrates EKG Interpretation Date/Time:  Friday February 09 2023 14:25:07 EDT Ventricular Rate:  72 PR Interval:  136 QRS Duration:  84 QT Interval:  374 QTC Calculation: 409 R Axis:   67  Text Interpretation: Normal sinus rhythm Normal ECG When compared with ECG of 18-Jul-2018 21:33, Rate slower Confirmed by Donato Schultz ([id number]) on 02/09/2023 2:29:07 PM    Recent Labs: No results found for requested labs within last 365 days.  Recent Lipid Panel No results found for: "CHOL", "TRIG", "HDL", "CHOLHDL", "VLDL", "LDLCALC", "LDLDIRECT"   Risk Assessment/Calculations:              Physical Exam:    VS:  BP (!) 144/88   Pulse 86   Ht 5\' 11"  (1.803 m)   Wt 184 lb 6.4 oz (83.6 kg)   SpO2 97%   BMI 25.72 kg/m     Wt Readings from Last 3 Encounters:  02/09/23 184 lb 6.4 oz (83.6 kg)  05/26/19 173 lb 8 oz (78.7 kg)  04/14/19 170 lb (77.1 kg)     GEN:  Well nourished, well developed in no acute distress HEENT: Normal NECK: No JVD; No carotid bruits LYMPHATICS: No lymphadenopathy CARDIAC: RRR, no murmurs, rubs, gallops RESPIRATORY:  Clear to auscultation without rales, wheezing or rhonchi  ABDOMEN: Soft, non-tender, non-distended MUSCULOSKELETAL:  No edema; No deformity  SKIN: Warm and dry NEUROLOGIC:  Alert and oriented x 3 PSYCHIATRIC:  Normal affect   ASSESSMENT:    1. Hypertension, unspecified type   2. Chest pain of uncertain etiology   3. SOB (shortness of breath)   4. Pre-procedure lab exam    PLAN:    In order of problems listed above:  Assessment and Plan    Chest Pain and Shortness of Breath Patient reports intermittent chest pressure, fear of choking, and shortness of breath, particularly while driving. Symptoms have been present for  approximately six months and can last all day. No associated syncope. Patient has a history of vaping and previous smoking. Patient also reports increased work stress. Physical exam unremarkable. -Order Coronary CT scan to assess for any plaque or significant blockage in the coronary arteries. -Order Echocardiogram to assess cardiac structure and function, particularly in light of patient's history of hemochromatosis.  Hemochromatosis Patient has a known diagnosis of hemochromatosis, with ferritin levels slowly increasing. No recent blood dumps required. -Continue regular monitoring with Dr. Marca Ancona.  Hypertension Patient is on Amlodipine 5mg  daily and Urbasartan 300mg  daily. -Continue current medication regimen.  Hypertriglyceridemia Recent labs showed elevated triglycerides  at 331. -Continue monitoring.  If cardiac workup is normal, consider referral back to Dr. Marca Ancona for potential gastrointestinal causes of symptoms.                Medication Adjustments/Labs and Tests Ordered: Current medicines are reviewed at length with the patient today.  Concerns regarding medicines are outlined above.  Orders Placed This Encounter  Procedures   CT CORONARY MORPH W/CTA COR W/SCORE W/CA W/CM &/OR WO/CM   Basic metabolic panel   EKG 12-Lead   ECHOCARDIOGRAM COMPLETE   Meds ordered this encounter  Medications   metoprolol tartrate (LOPRESSOR) 100 MG tablet    Sig: Take 1 tablet (100 mg total) by mouth as directed. Take one tablet 2 hours before CT scan    Dispense:  1 tablet    Refill:  0    Patient Instructions  Medication Instructions:  The current medical regimen is effective;  continue present plan and medications.  *If you need a refill on your cardiac medications before your next appointment, please call your pharmacy*   Lab Work: Please have blood work today (BMP)  If you have labs (blood work) drawn today and your tests are completely normal, you will receive your  results only by: MyChart Message (if you have MyChart) OR A paper copy in the mail If you have any lab test that is abnormal or we need to change your treatment, we will call you to review the results.   Testing/Procedures: Your physician has requested that you have an echocardiogram. Echocardiography is a painless test that uses sound waves to create images of your heart. It provides your doctor with information about the size and shape of your heart and how well your heart's chambers and valves are working. This procedure takes approximately one hour. There are no restrictions for this procedure. Please do NOT wear cologne, perfume, aftershave, or lotions (deodorant is allowed). Please arrive 15 minutes prior to your appointment time.    Your cardiac CT will be scheduled at:   [facility] [address] [place], [place] [address] [phone]  Please arrive at the [facility] (Entrance C2) of [facility] 30 minutes prior to test start time. You can use the FREE valet parking offered at entrance C (encouraged to control the heart rate for the test)  Proceed to the [facility] Radiology Department (first floor) to check-in and test prep.  All radiology patients and guests should use entrance C2 at [facility], accessed from [facility], even though the hospital's physical address listed is [address]    Please follow these instructions carefully (unless otherwise directed):  An IV will be required for this test and Nitroglycerin will be given.  Hold all erectile dysfunction medications at least 3 days (72 hrs) prior to test. (Ie viagra, cialis, sildenafil, tadalafil, etc)   On the Night Before the Test: Be sure to Drink plenty of water. Do not consume any caffeinated/decaffeinated beverages or chocolate 12 hours prior to your test. Do not take any antihistamines 12 hours prior to your test.  On the Day of  the Test: Drink plenty of water until 1 hour prior to the test. Do not eat any food 1 hour prior to test. You may take your regular medications prior to the test.  Take metoprolol (Lopressor) two hours prior to test.  After the Test: Drink plenty of water. After receiving IV contrast, you may experience a mild flushed  feeling. This is normal. On occasion, you may experience a mild rash up to 24 hours after the test. This is not dangerous. If this occurs, you can take Benadryl 25 mg and increase your fluid intake. If you experience trouble breathing, this can be serious. If it is severe call 911 IMMEDIATELY. If it is mild, please call our office.  We will call to schedule your test 2-4 weeks out understanding that some insurance companies will need an authorization prior to the service being performed.   For more information and frequently asked questions, please visit our website : [email or web address]  For non-scheduling related questions, please contact the cardiac imaging nurse navigator should you have any questions/concerns: Cardiac Imaging Nurse Navigators Direct Office Dial: [phone]   For scheduling needs, including cancellations and rescheduling, please call [place], [phone].   Follow-Up: At [facility], you and your health needs are our priority.  As part of our continuing mission to provide you with exceptional heart care, we have created designated Provider Care Teams.  These Care Teams include your primary Cardiologist (physician) and Advanced Practice Providers (APPs -  Physician Assistants and Nurse Practitioners) who all work together to provide you with the care you need, when you need it.  We recommend signing up for the patient portal called "MyChart".  Sign up information is provided on this After Visit Summary.  MyChart is used to connect with patients for Virtual Visits (Telemedicine).  Patients are able to view lab/test results,  encounter notes, upcoming appointments, etc.  Non-urgent messages can be sent to your provider as well.   To learn more about what you can do with MyChart, go to [email or web address].    Your next appointment:   Follow up will be based on the results of the above testing.    Signed, Donato Schultz, MD  02/09/2023 3:08 PM    [facility]

## 2023-02-10 ENCOUNTER — Other Ambulatory Visit: Payer: Self-pay | Admitting: Cardiology

## 2023-02-10 LAB — BASIC METABOLIC PANEL
BUN/Creatinine Ratio: 13 (ref 9–20)
BUN: 15 mg/dL (ref 6–24)
CO2: 23 mmol/L (ref 20–29)
Calcium: 9.7 mg/dL (ref 8.7–10.2)
Chloride: 100 mmol/L (ref 96–106)
Creatinine, Ser: 1.14 mg/dL (ref 0.76–1.27)
Glucose: 113 mg/dL — ABNORMAL HIGH (ref 70–99)
Potassium: 3.9 mmol/L (ref 3.5–5.2)
Sodium: 140 mmol/L (ref 134–144)
eGFR: 78 mL/min/{1.73_m2} (ref >59)

## 2023-02-11 ENCOUNTER — Other Ambulatory Visit: Payer: Self-pay | Admitting: Cardiology

## 2023-02-12 ENCOUNTER — Other Ambulatory Visit: Payer: Self-pay | Admitting: Cardiology

## 2023-02-13 ENCOUNTER — Other Ambulatory Visit: Payer: Self-pay | Admitting: Cardiology

## 2023-02-14 ENCOUNTER — Encounter (HOSPITAL_COMMUNITY): Payer: Self-pay

## 2023-02-14 ENCOUNTER — Telehealth (HOSPITAL_COMMUNITY): Payer: Self-pay | Admitting: *Deleted

## 2023-02-14 NOTE — Telephone Encounter (Signed)
Reaching out to patient to offer assistance regarding upcoming cardiac imaging study; pt verbalizes understanding of appt date/time, parking situation and where to check in, pre-test NPO status and medications ordered, and verified current allergies; name and call back number provided for further questions should they arise Hayley Sharpe RN Navigator Cardiac Imaging [facility] [phone] office [phone] cell  

## 2023-02-15 ENCOUNTER — Ambulatory Visit (HOSPITAL_COMMUNITY)
Admission: RE | Admit: 2023-02-15 | Discharge: 2023-02-15 | Disposition: A | Discharge status: Home / Self Care | Payer: BC Managed Care – PPO | Source: Ambulatory Visit | Attending: Cardiology | Admitting: Cardiology

## 2023-02-15 DIAGNOSIS — R0602 Shortness of breath: Secondary | ICD-10-CM | POA: Insufficient documentation

## 2023-02-15 DIAGNOSIS — R079 Chest pain, unspecified: Secondary | ICD-10-CM | POA: Insufficient documentation

## 2023-02-15 DIAGNOSIS — I2510 Atherosclerotic heart disease of native coronary artery without angina pectoris: Secondary | ICD-10-CM

## 2023-02-15 MED ORDER — IOHEXOL 350 MG/ML SOLN
100.0000 mL | Freq: Once | INTRAVENOUS | Status: AC | PRN
  Administered 2023-02-15: 100 mL via INTRAVENOUS

## 2023-02-15 MED ORDER — NITROGLYCERIN 0.4 MG SL SUBL
0.8000 mg | SUBLINGUAL_TABLET | Freq: Once | SUBLINGUAL | Status: AC
  Administered 2023-02-15: 0.8 mg via SUBLINGUAL

## 2023-02-15 MED ORDER — NITROGLYCERIN 0.4 MG SL SUBL
SUBLINGUAL_TABLET | SUBLINGUAL | Status: AC
  Filled 2023-02-15: qty 2

## 2023-02-19 ENCOUNTER — Encounter: Payer: Self-pay | Admitting: Cardiology

## 2023-02-19 DIAGNOSIS — Z79899 Other long term (current) drug therapy: Secondary | ICD-10-CM

## 2023-02-19 DIAGNOSIS — I2510 Atherosclerotic heart disease of native coronary artery without angina pectoris: Secondary | ICD-10-CM

## 2023-02-20 MED ORDER — ROSUVASTATIN CALCIUM 10 MG PO TABS: 10.0000 mg | ORAL_TABLET | Freq: Every day | ORAL | 3 refills | Status: DC

## 2023-03-01 ENCOUNTER — Ambulatory Visit (HOSPITAL_COMMUNITY): Payer: BC Managed Care – PPO | Attending: Internal Medicine

## 2023-03-01 DIAGNOSIS — R079 Chest pain, unspecified: Secondary | ICD-10-CM | POA: Insufficient documentation

## 2023-03-01 DIAGNOSIS — I5030 Unspecified diastolic (congestive) heart failure: Secondary | ICD-10-CM

## 2023-03-01 DIAGNOSIS — R0602 Shortness of breath: Secondary | ICD-10-CM | POA: Insufficient documentation

## 2023-03-01 LAB — ECHOCARDIOGRAM COMPLETE
Area-P 1/2: 2.86 cm2
S' Lateral: 2.9 cm

## 2023-03-06 DIAGNOSIS — R972 Elevated prostate specific antigen [PSA]: Secondary | ICD-10-CM | POA: Diagnosis not present

## 2023-04-05 DIAGNOSIS — R972 Elevated prostate specific antigen [PSA]: Secondary | ICD-10-CM | POA: Diagnosis not present

## 2023-04-10 IMAGING — DX DG KNEE 3 VIEWS*R*
3 series · 3 of 3 positions shown · non-contrast
Comparison: None.

CLINICAL DATA: Lateral right knee pain and swelling.

EXAM:
RIGHT KNEE - 3 VIEW

[dg knee 3 views right (1 of 3)]
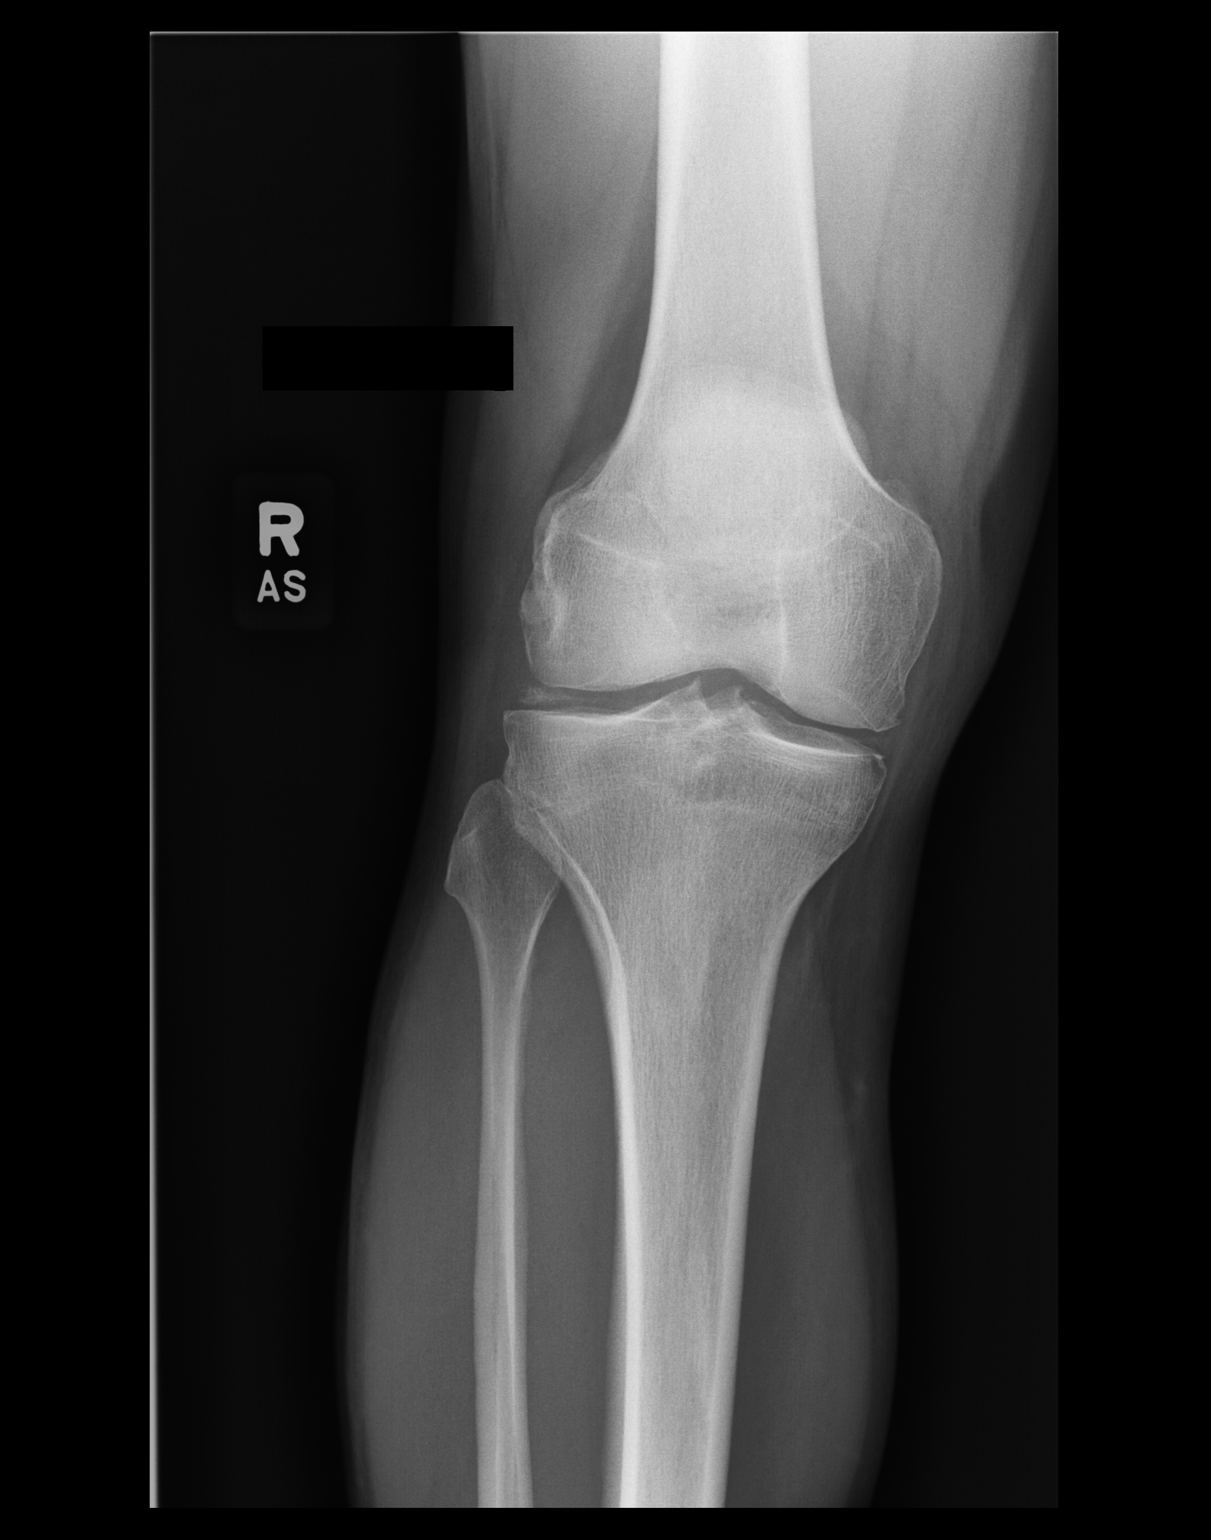

[dg knee 3 views right (2 of 3)]
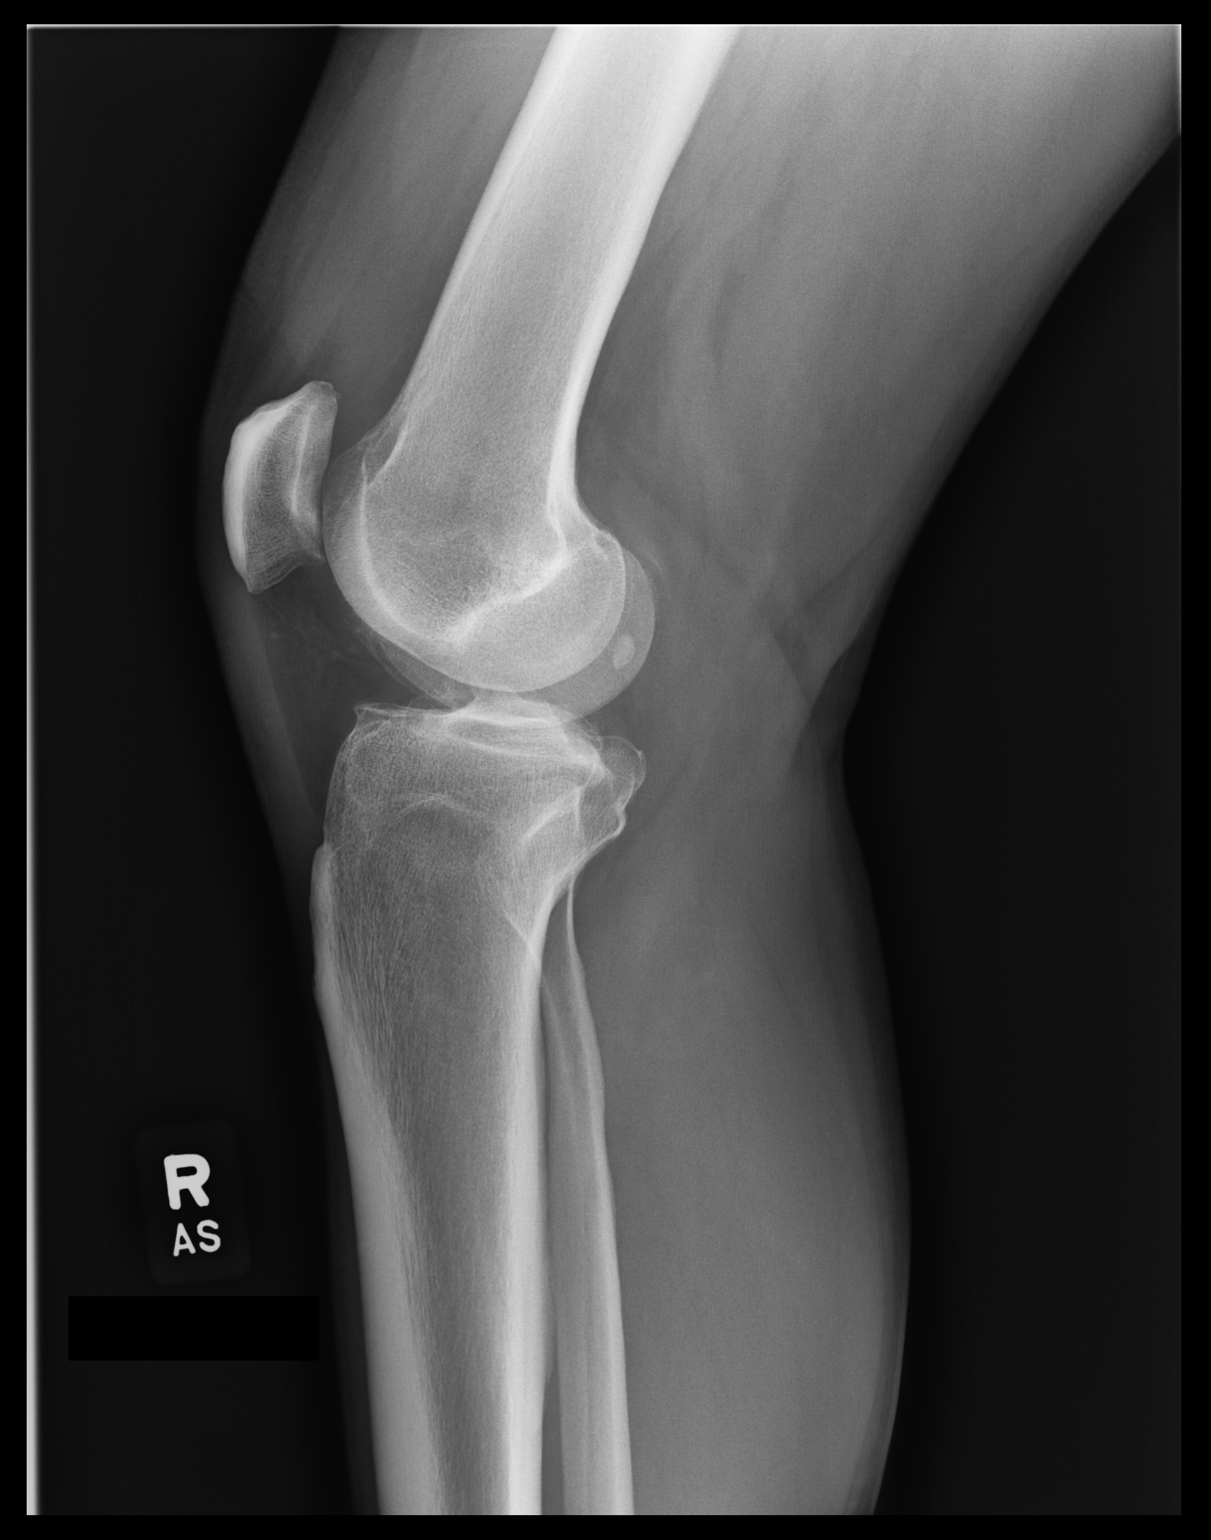

[dg knee 3 views right (3 of 3)]
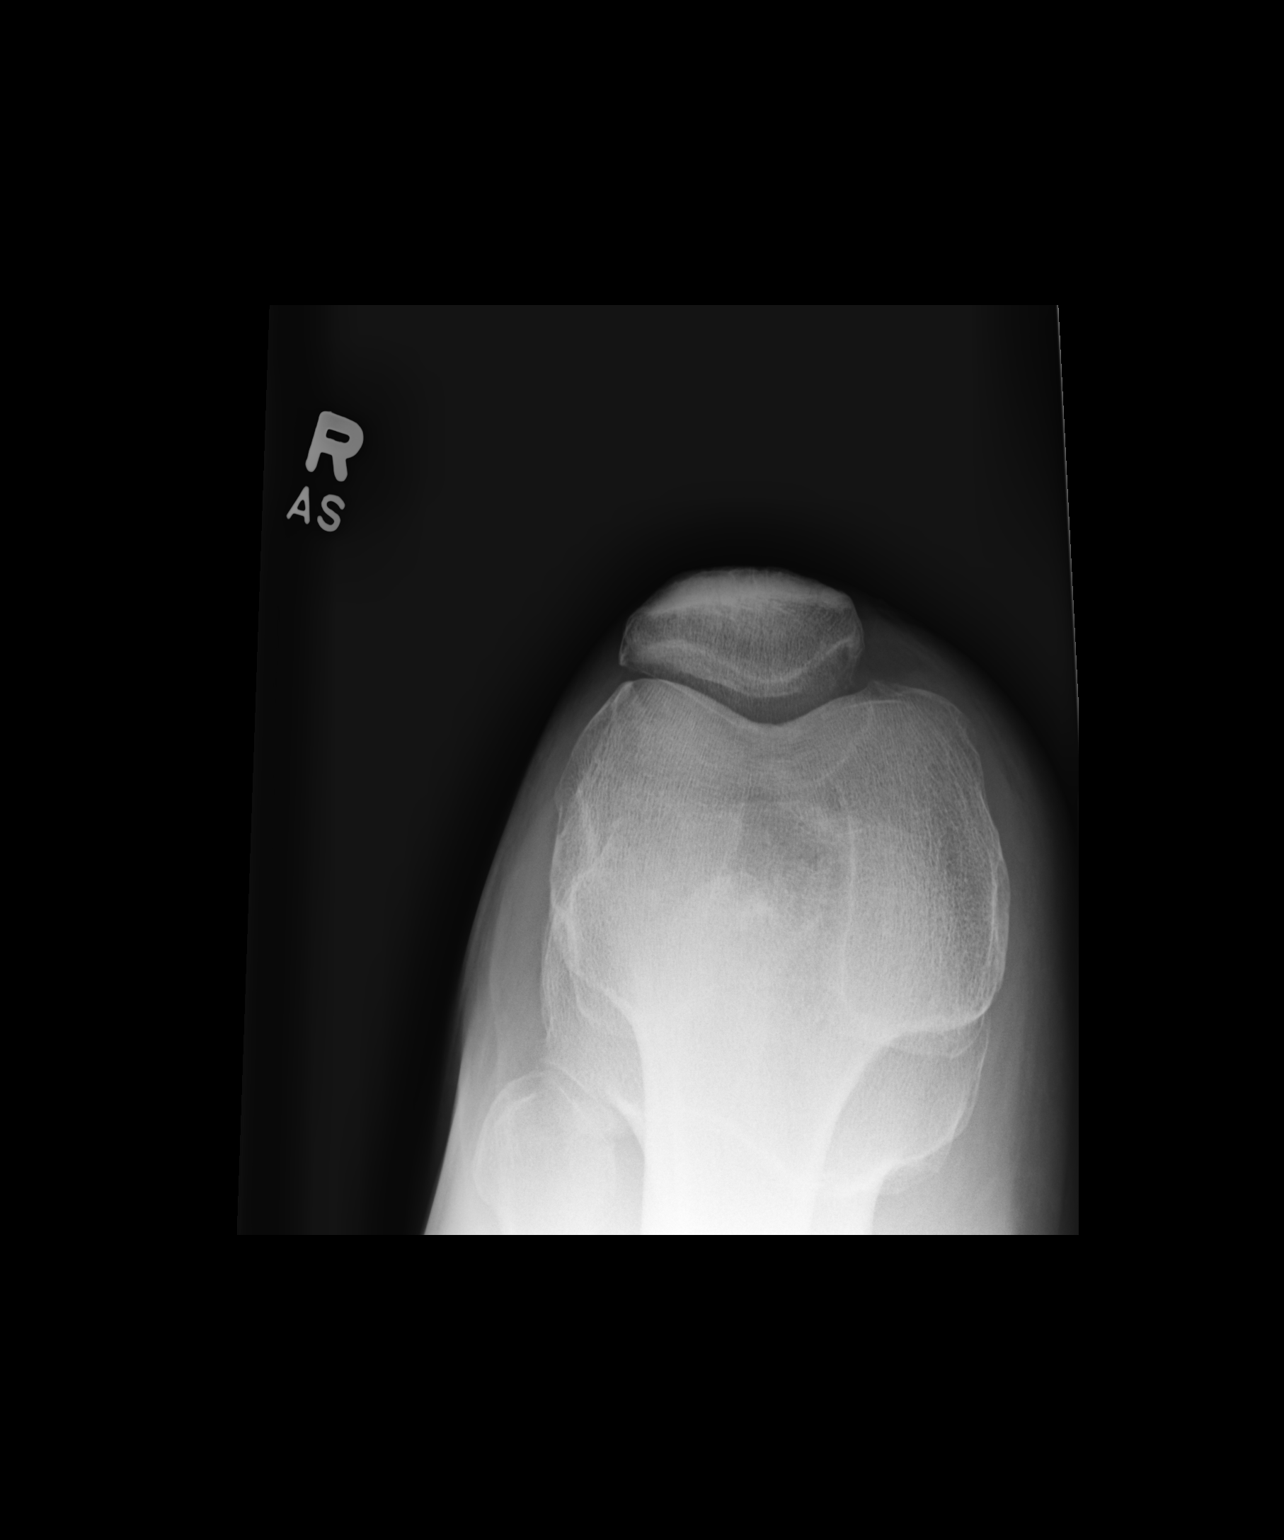

[3 of 3 positions shown; findings below may reference images not displayed]

FINDINGS: No evidence of fracture, dislocation. Mild narrow femoral tibial
joint space is identified. Chondrocalcinosis identified lateral
femoral tibial joint space. Suprapatellar effusion noted. Soft
tissues are unremarkable.
IMPRESSION: No acute fracture or dislocation. Degenerative joint changes of
right. Suprapatellar effusion.

## 2023-04-25 ENCOUNTER — Ambulatory Visit: Payer: BC Managed Care – PPO | Attending: Nurse Practitioner

## 2023-04-25 DIAGNOSIS — I2510 Atherosclerotic heart disease of native coronary artery without angina pectoris: Secondary | ICD-10-CM

## 2023-04-25 DIAGNOSIS — Z79899 Other long term (current) drug therapy: Secondary | ICD-10-CM | POA: Diagnosis not present

## 2023-04-25 DIAGNOSIS — I2584 Coronary atherosclerosis due to calcified coronary lesion: Secondary | ICD-10-CM | POA: Diagnosis not present

## 2023-04-26 LAB — LIPID PANEL
Chol/HDL Ratio: 2.6 ratio (ref 0.0–5.0)
Cholesterol, Total: 161 mg/dL (ref 100–199)
HDL: 62 mg/dL (ref >39)
LDL Chol Calc (NIH): 73 mg/dL (ref 0–99)
Triglycerides: 150 mg/dL — ABNORMAL HIGH (ref 0–149)
VLDL Cholesterol Cal: 26 mg/dL (ref 5–40)

## 2023-05-03 ENCOUNTER — Other Ambulatory Visit: Payer: Self-pay | Admitting: *Deleted

## 2023-05-03 DIAGNOSIS — I2510 Atherosclerotic heart disease of native coronary artery without angina pectoris: Secondary | ICD-10-CM

## 2023-05-03 DIAGNOSIS — Z79899 Other long term (current) drug therapy: Secondary | ICD-10-CM

## 2023-06-13 DIAGNOSIS — F410 Panic disorder [episodic paroxysmal anxiety] without agoraphobia: Secondary | ICD-10-CM | POA: Diagnosis not present

## 2023-06-26 DIAGNOSIS — F410 Panic disorder [episodic paroxysmal anxiety] without agoraphobia: Secondary | ICD-10-CM | POA: Diagnosis not present

## 2023-10-01 DIAGNOSIS — R972 Elevated prostate specific antigen [PSA]: Secondary | ICD-10-CM | POA: Diagnosis not present

## 2024-01-08 DIAGNOSIS — G4700 Insomnia, unspecified: Secondary | ICD-10-CM | POA: Diagnosis not present

## 2024-01-08 DIAGNOSIS — Z0000 Encounter for general adult medical examination without abnormal findings: Secondary | ICD-10-CM | POA: Diagnosis not present

## 2024-01-08 DIAGNOSIS — E559 Vitamin D deficiency, unspecified: Secondary | ICD-10-CM | POA: Diagnosis not present

## 2024-01-08 DIAGNOSIS — I10 Essential (primary) hypertension: Secondary | ICD-10-CM | POA: Diagnosis not present

## 2024-01-08 DIAGNOSIS — E781 Pure hyperglyceridemia: Secondary | ICD-10-CM | POA: Diagnosis not present

## 2024-01-08 DIAGNOSIS — Z23 Encounter for immunization: Secondary | ICD-10-CM | POA: Diagnosis not present

## 2024-02-07 ENCOUNTER — Other Ambulatory Visit: Payer: Self-pay | Admitting: Cardiology

## 2024-03-08 ENCOUNTER — Other Ambulatory Visit: Payer: Self-pay | Admitting: Cardiology

## 2024-05-14 DIAGNOSIS — R972 Elevated prostate specific antigen [PSA]: Secondary | ICD-10-CM | POA: Diagnosis not present
